# Patient Record
Sex: Female | Born: 1935 | Race: White | Hispanic: No | Smoking: Never smoker
Health system: Southern US, Community
[De-identification: ages and names within clinical notes are randomized; demographics above are authoritative.]

## PROBLEM LIST (undated history)

## (undated) DIAGNOSIS — E039 Hypothyroidism, unspecified: Secondary | ICD-10-CM

## (undated) DIAGNOSIS — E119 Type 2 diabetes mellitus without complications: Secondary | ICD-10-CM

## (undated) DIAGNOSIS — H6692 Otitis media, unspecified, left ear: Secondary | ICD-10-CM

## (undated) DIAGNOSIS — I10 Essential (primary) hypertension: Secondary | ICD-10-CM

## (undated) DIAGNOSIS — Z8701 Personal history of pneumonia (recurrent): Secondary | ICD-10-CM

## (undated) DIAGNOSIS — D071 Carcinoma in situ of vulva: Secondary | ICD-10-CM

## (undated) HISTORY — PX: CATARACT EXTRACTION W/ INTRAOCULAR LENS IMPLANT: SHX1309

---
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
## 1970-11-13 HISTORY — PX: ABDOMINAL HYSTERECTOMY: SHX81

## 1998-11-13 HISTORY — PX: SHOULDER ARTHROSCOPY: SHX128

## 2000-04-06 ENCOUNTER — Other Ambulatory Visit: Admission: RE | Admit: 2000-04-06 | Discharge: 2000-04-06 | Payer: Self-pay | Admitting: Family Medicine

## 2004-02-25 ENCOUNTER — Other Ambulatory Visit: Admission: RE | Admit: 2004-02-25 | Discharge: 2004-02-25 | Payer: Self-pay | Admitting: Family Medicine

## 2004-03-01 ENCOUNTER — Encounter: Payer: Self-pay | Admitting: Family Medicine

## 2004-03-15 ENCOUNTER — Encounter: Admission: RE | Admit: 2004-03-15 | Discharge: 2004-06-13 | Payer: Self-pay | Admitting: Family Medicine

## 2005-03-02 ENCOUNTER — Ambulatory Visit: Payer: Self-pay | Admitting: Family Medicine

## 2005-03-14 ENCOUNTER — Ambulatory Visit: Payer: Self-pay | Admitting: Family Medicine

## 2005-04-14 ENCOUNTER — Ambulatory Visit: Payer: Self-pay | Admitting: Internal Medicine

## 2005-05-02 ENCOUNTER — Ambulatory Visit: Payer: Self-pay | Admitting: Internal Medicine

## 2005-10-23 ENCOUNTER — Ambulatory Visit: Payer: Self-pay | Admitting: Family Medicine

## 2006-01-04 ENCOUNTER — Ambulatory Visit: Payer: Self-pay | Admitting: Family Medicine

## 2006-07-11 ENCOUNTER — Ambulatory Visit: Payer: Self-pay | Admitting: Family Medicine

## 2006-09-26 ENCOUNTER — Ambulatory Visit: Payer: Self-pay | Admitting: Family Medicine

## 2006-11-21 ENCOUNTER — Encounter: Admission: RE | Admit: 2006-11-21 | Discharge: 2006-11-21 | Payer: Self-pay | Admitting: Family Medicine

## 2006-11-21 ENCOUNTER — Ambulatory Visit: Payer: Self-pay | Admitting: Family Medicine

## 2007-03-05 ENCOUNTER — Ambulatory Visit: Payer: Self-pay | Admitting: Family Medicine

## 2007-04-04 ENCOUNTER — Ambulatory Visit: Payer: Self-pay | Admitting: Family Medicine

## 2007-04-04 ENCOUNTER — Encounter: Payer: Self-pay | Admitting: Family Medicine

## 2007-10-01 ENCOUNTER — Ambulatory Visit: Payer: Self-pay | Admitting: Family Medicine

## 2007-10-29 ENCOUNTER — Encounter: Payer: Self-pay | Admitting: Family Medicine

## 2007-10-29 ENCOUNTER — Telehealth: Payer: Self-pay | Admitting: Family Medicine

## 2008-01-22 ENCOUNTER — Ambulatory Visit: Payer: Self-pay | Admitting: Family Medicine

## 2008-01-22 DIAGNOSIS — E039 Hypothyroidism, unspecified: Secondary | ICD-10-CM | POA: Insufficient documentation

## 2008-01-22 DIAGNOSIS — E669 Obesity, unspecified: Secondary | ICD-10-CM | POA: Insufficient documentation

## 2008-01-28 ENCOUNTER — Telehealth: Payer: Self-pay | Admitting: Family Medicine

## 2008-07-15 ENCOUNTER — Encounter: Payer: Self-pay | Admitting: Family Medicine

## 2008-09-02 ENCOUNTER — Ambulatory Visit: Payer: Self-pay | Admitting: Family Medicine

## 2008-10-06 ENCOUNTER — Ambulatory Visit: Payer: Self-pay | Admitting: Family Medicine

## 2008-10-06 DIAGNOSIS — M722 Plantar fascial fibromatosis: Secondary | ICD-10-CM

## 2008-10-06 DIAGNOSIS — E1165 Type 2 diabetes mellitus with hyperglycemia: Secondary | ICD-10-CM

## 2008-10-12 LAB — CONVERTED CEMR LAB: Hgb A1c MFr Bld: 7.3 % — ABNORMAL HIGH (ref 4.6–6.0)

## 2009-02-09 ENCOUNTER — Telehealth: Payer: Self-pay | Admitting: Family Medicine

## 2009-03-23 ENCOUNTER — Encounter: Payer: Self-pay | Admitting: Family Medicine

## 2009-03-23 ENCOUNTER — Ambulatory Visit: Payer: Self-pay | Admitting: Family Medicine

## 2009-03-23 ENCOUNTER — Encounter (INDEPENDENT_AMBULATORY_CARE_PROVIDER_SITE_OTHER): Payer: Self-pay | Admitting: *Deleted

## 2009-03-23 ENCOUNTER — Other Ambulatory Visit: Admission: RE | Admit: 2009-03-23 | Discharge: 2009-03-23 | Payer: Self-pay | Admitting: Family Medicine

## 2009-03-23 DIAGNOSIS — E785 Hyperlipidemia, unspecified: Secondary | ICD-10-CM | POA: Insufficient documentation

## 2009-03-24 LAB — CONVERTED CEMR LAB
ALT: 17 units/L (ref 0–35)
AST: 23 units/L (ref 0–37)
Albumin: 3.8 g/dL (ref 3.5–5.2)
Basophils Absolute: 0.1 10*3/uL (ref 0.0–0.1)
Bilirubin, Direct: 0.1 mg/dL (ref 0.0–0.3)
CO2: 30 meq/L (ref 19–32)
GFR calc non Af Amer: 57.79 mL/min (ref >60)
Glucose, Bld: 68 mg/dL — ABNORMAL LOW (ref 70–99)
HCT: 36.5 % (ref 36.0–46.0)
Hemoglobin: 12.5 g/dL (ref 12.0–15.0)
LDL Cholesterol: 61 mg/dL (ref 0–99)
Lymphocytes Relative: 33.6 % (ref 12.0–46.0)
Lymphs Abs: 2.2 10*3/uL (ref 0.7–4.0)
MCHC: 34.2 g/dL (ref 30.0–36.0)
Monocytes Absolute: 0.2 10*3/uL (ref 0.1–1.0)
Monocytes Relative: 2.6 % — ABNORMAL LOW (ref 3.0–12.0)
Neutrophils Relative %: 60.4 % (ref 43.0–77.0)
RBC: 4.48 M/uL (ref 3.87–5.11)
RDW: 15 % — ABNORMAL HIGH (ref 11.5–14.6)
Sodium: 148 meq/L — ABNORMAL HIGH (ref 135–145)
Total CHOL/HDL Ratio: 2
Triglycerides: 77 mg/dL (ref 0.0–149.0)
VLDL: 15.4 mg/dL (ref 0.0–40.0)
WBC: 6.4 10*3/uL (ref 4.5–10.5)

## 2009-04-01 ENCOUNTER — Encounter: Payer: Self-pay | Admitting: Family Medicine

## 2009-06-17 ENCOUNTER — Ambulatory Visit: Payer: Self-pay | Admitting: Family Medicine

## 2009-06-18 ENCOUNTER — Telehealth (INDEPENDENT_AMBULATORY_CARE_PROVIDER_SITE_OTHER): Payer: Self-pay | Admitting: *Deleted

## 2009-08-18 ENCOUNTER — Ambulatory Visit: Payer: Self-pay | Admitting: Family Medicine

## 2010-04-12 ENCOUNTER — Ambulatory Visit: Payer: Self-pay | Admitting: Family Medicine

## 2010-04-13 LAB — CONVERTED CEMR LAB
BUN: 19 mg/dL (ref 6–23)
Calcium: 9.6 mg/dL (ref 8.4–10.5)
Chloride: 102 meq/L (ref 96–112)
Creatinine, Ser: 1 mg/dL (ref 0.4–1.2)
Glucose, Bld: 80 mg/dL (ref 70–99)
Potassium: 5 meq/L (ref 3.5–5.1)
Sodium: 140 meq/L (ref 135–145)

## 2010-04-14 ENCOUNTER — Telehealth: Payer: Self-pay | Admitting: Family Medicine

## 2010-05-11 ENCOUNTER — Telehealth: Payer: Self-pay | Admitting: Family Medicine

## 2010-05-12 ENCOUNTER — Encounter: Payer: Self-pay | Admitting: Family Medicine

## 2010-12-13 NOTE — Letter (Signed)
Summary: Generic Letter  [place] [facility]  [address] [place], [place] [address]   Phone: [phone]  Fax: [phone]    05/12/2010  To Whom It May Concern:  Mrs. Isobel Eisenhuth is physically fit to participate in swimming activities. If you have any concerns or questions please feel free to contact me.            Sincerely,      Dr. Hosie Spangle.[address]

## 2010-12-13 NOTE — Progress Notes (Signed)
Summary: note to swim  Phone Note Call from Patient   Caller: Patient Call For: Judithann Sheen MD Summary of Call: Needs note written on prescription pad stating s he is in good health to swim. [phone] Initial call taken by: Lynann Beaver CMA,  May 11, 2010 8:19 AM  Follow-up for Phone Call        ok to pick up today  Follow-up by: Pura Spice, RN,  May 12, 2010 8:10 AM  Additional Follow-up for Phone Call Additional follow up Details #1::        Notified pt. Additional Follow-up by: Lynann Beaver CMA,  May 12, 2010 8:19 AM

## 2010-12-13 NOTE — Progress Notes (Signed)
Summary: Marissa Ballard pt on brand name   Phone Note Call from Patient Call back at Home Phone [phone]   Caller: Patient Call For: Judithann Sheen MD Summary of Call: PT NEEDS GENERIC SYNTHOID 150 MCG CALL INTO [facility] [phone] Initial call taken by: Heron Sabins,  April 14, 2010 3:37 PM  Follow-up for Phone Call        pt on brand name and pt aware rx called in  Follow-up by: Pura Spice, RN,  April 14, 2010 5:03 PM     Appended Document: Marissa Ballard pt on brand name  spoke with pt and she thought she was on brand synthroid but  she has been on generic synthroid and called refills generic  synthroid called to [facility]  [address]

## 2010-12-13 NOTE — Assessment & Plan Note (Signed)
Summary: med check/refill/pt req to get thyroid rechecked/cjr   Vital Signs:  Patient profile:   77 year old female Weight:      208 pounds BMI:     35.83 O2 Sat:      95 % Temp:     98.5 degrees F Pulse rate:   56 / minute Pulse rhythm:   regular BP sitting:   124 / 82  (left arm)  Vitals Entered By: Pura Spice, RN (Apr 12, 2010 1:42 PM) CC: med ck and TSH to be ch'd    History of Present Illness: This 77 year old white married female with diabetes type 2 as well as hypothyroidism is in to have her medications refilled but does not desire a full physical examination at this time She is uncertain as to her blood glucose, relates she is now retired and does not feel as tired arthritis is controlled somewhat with diclofenac States will return for physical examination and later date  Allergies (verified): No Known Drug Allergies  Past History:  Past Medical History: Last updated: 04/04/2007 Diabetes mellitus, type I Hyperthyroidism  Past Surgical History: Last updated: 04/04/2007 Denies surgical history  Risk Factors: Smoking Status: never (04/04/2007)  Review of Systems      See HPI  The patient denies anorexia, fever, weight loss, weight gain, vision loss, decreased hearing, hoarseness, chest pain, syncope, dyspnea on exertion, peripheral edema, prolonged cough, headaches, hemoptysis, abdominal pain, melena, hematochezia, severe indigestion/heartburn, hematuria, incontinence, genital sores, muscle weakness, suspicious skin lesions, transient blindness, difficulty walking, depression, unusual weight change, abnormal bleeding, enlarged lymph nodes, angioedema, breast masses, and testicular masses.    Physical Exam  General:  Well-developed,well-nourished,in no acute distress; alert,appropriate and cooperative throughout examinationoverweight-appearing.   Lungs:  Normal respiratory effort, chest expands symmetrically. Lungs are clear to auscultation, no crackles or  wheezes. Heart:  Normal rate and regular rhythm. S1 and S2 normal without gallop, murmur, click, rub or other extra sounds. Extremities:  No clubbing, cyanosis, edema, or deformity noted with normal full range of motion of all joints.     Impression & Recommendations:  Problem # 1:  DIABETES MELLITUS, TYPE II, UNCONTROLLED (ICD-250.02) Assessment Improved  Her updated medication list for this problem includes:    Glipizide 10 Mg Tb24 (Glipizide) .Marland Kitchen... 1 am and 1 pm    Metformin Hcl 500 Mg Tabs (Metformin hcl) .Marland Kitchen... 1 am and p.m. for diabetes  Problem # 2:  OBESITY (ICD-278.00) Assessment: Unchanged  Problem # 3:  HYPOTHYROIDISM (ICD-244.9) Assessment: Improved  Her updated medication list for this problem includes:    Synthroid 150 Mcg Tabs (Levothyroxine sodium) .Marland Kitchen... 1 qd  Orders: Prescription Created Electronically [phone])  Problem # 4:  ARTHRITIS (ICD-716.90) Assessment: Unchanged diclofenac 75 mg b.i.d. p.c.  Complete Medication List: 1)  Glipizide 10 Mg Tb24 (Glipizide) .Marland Kitchen.. 1 am and 1 pm 2)  Synthroid 150 Mcg Tabs (Levothyroxine sodium) .Marland Kitchen.. 1 qd 3)  Vitamin D 56433 Unit Caps (Ergocalciferol) .Marland KitchenMarland KitchenMarland Kitchen 1 weekly for 12 weeks 4)  Diclofenac Sodium 75 Mg Tbec (Diclofenac sodium) .Marland Kitchen.. 1 two times a day  for inflammation 5)  Phenteramine 37.5  .Marland Kitchen.. 1 each am to decrease appetite 6)  Metformin Hcl 500 Mg Tabs (Metformin hcl) .Marland Kitchen.. 1 am and p.m. for diabetes  Other Orders: Venipuncture ([address]) TLB-A1C / Hgb A1C (Glycohemoglobin) (83036-A1C) TLB-TSH (Thyroid Stimulating Hormone) (84443-TSH) TLB-BMP (Basic Metabolic Panel-BMET) (80048-METABOL)  Patient Instructions: 1)  have ordered lab studies and will call results when they return. Have refilled her  medications as you requested 2)  Would like for you to return for full physical evaluation. 3)  His diabetes is not controlled we'll add metformin 500 mg b.i.d. Prescriptions: METFORMIN HCL 500 MG TABS (METFORMIN HCL) 1 AM and  p.m. for diabetes  #60 x 11   Entered and Authorized by:   Judithann Sheen MD   Signed by:   Judithann Sheen MD on 04/18/2010   Method used:   Electronically to        [facility] (retail)       [address]       [place], [place]  [address]       Ph: [phone]       Fax: [phone]   RxID:   [id number] SYNTHROID 150 MCG TABS (LEVOTHYROXINE SODIUM) 1 qd Brand medically necessary #30 x 6   Entered by:   Pura Spice, RN   Authorized by:   Judithann Sheen MD   Signed by:   Pura Spice, RN on 04/14/2010   Method used:   Electronically to        [facility] (retail)       [address]       [place], [place]  [address]       Ph: [phone]       Fax: [phone]   RxID:   [id number] GLIPIZIDE 10 MG TB24 (GLIPIZIDE) 1 AM and 1 PM  #60 x 11   Entered and Authorized by:   Judithann Sheen MD   Signed by:   Judithann Sheen MD on 04/12/2010   Method used:   Electronically to        [facility] (retail)       [address]       [place], [place]  [address]       Ph: [phone]       Fax: [phone]   RxID:   [id number] SYNTHROID 150 MCG TABS (LEVOTHYROXINE SODIUM) 1 qd Brand medically necessary #30 x 11   Entered and Authorized by:   Judithann Sheen MD   Signed by:   Judithann Sheen MD on 04/12/2010   Method used:   Electronically to        [facility] (retail)       [address]       [place], [place]  [address]       Ph: [phone]       Fax: [phone]   RxID:   [id number]

## 2013-03-20 ENCOUNTER — Ambulatory Visit: Payer: Self-pay | Admitting: Family Medicine

## 2013-03-21 ENCOUNTER — Ambulatory Visit (INDEPENDENT_AMBULATORY_CARE_PROVIDER_SITE_OTHER): Payer: Self-pay | Admitting: Family Medicine

## 2013-03-21 ENCOUNTER — Encounter: Payer: Self-pay | Admitting: Family Medicine

## 2013-03-21 ENCOUNTER — Telehealth: Payer: Self-pay | Admitting: Family Medicine

## 2013-03-21 VITALS — BP 104/70 | HR 62 | Temp 98.1°F | Ht 61.5 in | Wt 202.0 lb

## 2013-03-21 DIAGNOSIS — Z7689 Persons encountering health services in other specified circumstances: Secondary | ICD-10-CM

## 2013-03-21 DIAGNOSIS — E1149 Type 2 diabetes mellitus with other diabetic neurological complication: Secondary | ICD-10-CM

## 2013-03-21 DIAGNOSIS — E1165 Type 2 diabetes mellitus with hyperglycemia: Secondary | ICD-10-CM

## 2013-03-21 DIAGNOSIS — E1142 Type 2 diabetes mellitus with diabetic polyneuropathy: Secondary | ICD-10-CM

## 2013-03-21 DIAGNOSIS — Z7189 Other specified counseling: Secondary | ICD-10-CM

## 2013-03-21 DIAGNOSIS — E039 Hypothyroidism, unspecified: Secondary | ICD-10-CM

## 2013-03-21 DIAGNOSIS — E785 Hyperlipidemia, unspecified: Secondary | ICD-10-CM

## 2013-03-21 LAB — LIPID PANEL
Cholesterol: 106 mg/dL (ref 0–200)
HDL: 50.5 mg/dL (ref >39.00)
LDL Cholesterol: 39 mg/dL (ref 0–99)
Total CHOL/HDL Ratio: 2

## 2013-03-21 LAB — BASIC METABOLIC PANEL
GFR: 51.76 mL/min — ABNORMAL LOW (ref >60.00)
Sodium: 140 mEq/L (ref 135–145)

## 2013-03-21 LAB — MICROALBUMIN / CREATININE URINE RATIO
Creatinine,U: 150.9 mg/dL
Microalb, Ur: 2 mg/dL — ABNORMAL HIGH (ref 0.0–1.9)

## 2013-03-21 MED ORDER — LEVOTHYROXINE SODIUM 125 MCG PO TABS: 125.0000 ug | ORAL_TABLET | Freq: Every day | ORAL | Status: DC

## 2013-03-21 MED ORDER — GLIPIZIDE 10 MG PO TABS: 10.0000 mg | ORAL_TABLET | Freq: Two times a day (BID) | ORAL | Status: DC

## 2013-03-21 MED ORDER — METFORMIN HCL 500 MG PO TABS: 500.0000 mg | ORAL_TABLET | Freq: Two times a day (BID) | ORAL | Status: DC

## 2013-03-21 NOTE — Telephone Encounter (Signed)
Please let her know:  -labs show diabetes is not doing well.  -it will be very important that she work on eating a healthy diet low or no carbs and get daily cardiovascular exercise. -also, I would advise starting metformin in addition to the glipizide. We discussed this at her appointment.  Needs to follow up in 3 months and will recheck diabetes then. Schedule appt for her Elease Hashimoto. Thanks.  She needs to check BS at home fasting a few times per week and if she ever feels bad.  If any blood sugar higher the 200 or lower then 70 she should call us immediatetly. If lower then 70 should eat a small snack or drink 4 oz of juice and recheck BS.

## 2013-03-21 NOTE — Patient Instructions (Addendum)
-  We have ordered labs or studies at this visit. It can take up to 1-2 weeks for results and processing. We will contact you with instructions IF your results are abnormal. Normal results will be released to your [facility]. If you have not heard from [place] or can not find your results in [facility] in 2 weeks please contact our office.  -PLEASE SIGN UP FOR MYCHART TODAY   We recommend the following healthy lifestyle measures: - eat a healthy diet consisting of lots of vegetables, fruits, beans, nuts, seeds, healthy meats such as white chicken and fish and whole grains.  - avoid fried foods, fast food, processed foods, sodas, red meet and other fattening foods.  - get a least 150 minutes of aerobic exercise per week.   -check on cost of pneumonia vaccine and you should get this  -check on cost of shingles vaccine  -schedule mammogram  -schedule eye exam  -check feet daily  -check fasting blood sugar a few times per week and keep a written log to bring to appointments  Follow up in: as instructed after labs

## 2013-03-21 NOTE — Progress Notes (Signed)
Chief Complaint  Patient presents with  . Establish Care    HPI:  Marissa Ballard is here to establish care. Used to see Dr. Scotty Court - she saw Dr. Leticia Clas, but she did not like the office staff there.   Last PCP and physical:   Has the following chronic problems and concerns today:  Diabetes: -on glipizide only 10mg  bid -does not check home blood sugars -active, diet ok -last eye exam: does not get eye exam  -las labs about 13 months ago -denies: polyuria, polydipsia, foot lesions, vision changes  -does not take asa daily - used to take daily  Hypothyroidism: -reports on synthroid for a long time -feels good -denies: heat/cold intolerance, fatigue, palpitations, skin changes  Pt denies ever having osteoporosis or hyperlipidemia. She report anemia with child birth but denies any since.  Patient Active Problem List   Diagnosis Date Noted  . HYPERLIPIDEMIA 03/23/2009  . DIABETES MELLITUS, TYPE II, UNCONTROLLED 10/06/2008  . PLANTAR FASCIITIS 10/06/2008  . HYPOTHYROIDISM 01/22/2008  . OBESITY 01/22/2008    Health Maintenance: -she does not get mammograms -she did get a colonoscopy less then 10 years ago and was noram -she reports she had pneumovax   ROS: See pertinent positives and negatives per HPI.  Past Medical History  Diagnosis Date  . Diabetes   . Thyroid disease     Family History  Problem Relation Age of Onset  . Breast cancer Mother   . Heart disease Mother   . Heart attack Father 55    History   Social History  . Marital Status: Married    Spouse Name: N/A    Number of Children: N/A  . Years of Education: N/A   Social History Main Topics  . Smoking status: Never Smoker   . Smokeless tobacco: None  . Alcohol Use: No  . Drug Use: None  . Sexually Active: None   Other Topics Concern  . 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      Home Situation: lives with her husband and  he requires a lot care as he has dementia and CHF      Spiritual Beliefs:       Lifestyle: active at work, no regular cardiovascular activity, diet - avoid beef and pork, weakness is sweets             Current outpatient prescriptions:glipiZIDE (GLUCOTROL) 10 MG tablet, Take 10 mg by mouth 2 (two) times daily before a meal., Disp: , Rfl: ;  levothyroxine (SYNTHROID, LEVOTHROID) 125 MCG tablet, Take 125 mcg by mouth daily before breakfast., Disp: , Rfl:   EXAM:  Filed Vitals:   03/21/13 0807  BP: 104/70  Pulse: 62  Temp: 98.1 F (36.7 C)    Body mass index is 37.55 kg/(m^2).  GENERAL: vitals reviewed and listed above, alert, oriented, appears well hydrated and in no acute distress  HEENT: atraumatic, conjunttiva clear, no obvious abnormalities on inspection of external nose and ears  NECK: no obvious masses on inspection  LUNGS: clear to auscultation bilaterally, no wheezes, rales or rhonchi, good air movement  CV: HRRR, no peripheral edema  MS: moves all extremities without noticeable abnormality  PSYCH: pleasant and cooperative, no obvious depression or anxiety  Diabetic foot exam: see in chart  ASSESSMENT AND PLAN:  Discussed the following assessment and plan:  Encounter to establish care - Plan: Basic metabolic panel  Hyperlipemia -  Plan: Lipid Panel  Type 2 diabetes, uncontrolled, with neuropathy - Plan: Hemoglobin A1c, Microalbumin/Creatinine Ratio, Urine  HYPOTHYROIDISM - Plan: TSH  -We reviewed the PMH, PSH, FH, SH, Meds and Allergies. -We provided refills for any medications we will prescribe as needed. -We addressed current concerns per orders and patient instructions. -We have advised patient to follow up per instructions below. -FASTING LABS today - will refill meds after labs result -discussed importance of lifestyle -she is resistant health maintenance measures  -Patient advised to return or notify a doctor immediately if symptoms worsen or  persist or new concerns arise.  There are no Patient Instructions on file for this visit.   [name] R.

## 2013-03-24 ENCOUNTER — Telehealth: Payer: Self-pay

## 2013-03-24 NOTE — Telephone Encounter (Signed)
Find out what [facility] pharmacy is requesting and what is needed. Thanks.

## 2013-03-24 NOTE — Telephone Encounter (Signed)
Left a message for pt to return call 

## 2013-03-24 NOTE — Telephone Encounter (Signed)
For Leveothyroxine 

## 2013-03-24 NOTE — Telephone Encounter (Signed)
Received a fax from [facility] wanting to change from a mylan product to a [employer] product. NTI drug. Pls advise.

## 2013-03-26 NOTE — Telephone Encounter (Signed)
Called and spoke with pt and pt is aware.  

## 2013-03-26 NOTE — Telephone Encounter (Signed)
Called and spoke with Okey Regal and medication changed to [employer] product (sent to Dr. Selena Batten due to sometimes switching causes a change in levels).  Verbal given.  Ok per Dr. Selena Batten.

## 2013-07-08 ENCOUNTER — Other Ambulatory Visit: Payer: Self-pay

## 2013-07-08 MED ORDER — METFORMIN HCL 500 MG PO TABS: 500.0000 mg | ORAL_TABLET | Freq: Two times a day (BID) | ORAL | Status: DC

## 2013-11-28 ENCOUNTER — Ambulatory Visit (INDEPENDENT_AMBULATORY_CARE_PROVIDER_SITE_OTHER): Payer: Self-pay | Admitting: Family Medicine

## 2013-11-28 ENCOUNTER — Encounter: Payer: Self-pay | Admitting: Family Medicine

## 2013-11-28 VITALS — BP 120/70 | Temp 97.6°F | Wt 205.0 lb

## 2013-11-28 DIAGNOSIS — E039 Hypothyroidism, unspecified: Secondary | ICD-10-CM

## 2013-11-28 DIAGNOSIS — E1165 Type 2 diabetes mellitus with hyperglycemia: Principal | ICD-10-CM

## 2013-11-28 DIAGNOSIS — M549 Dorsalgia, unspecified: Secondary | ICD-10-CM

## 2013-11-28 DIAGNOSIS — E785 Hyperlipidemia, unspecified: Secondary | ICD-10-CM

## 2013-11-28 DIAGNOSIS — M542 Cervicalgia: Secondary | ICD-10-CM

## 2013-11-28 DIAGNOSIS — B379 Candidiasis, unspecified: Secondary | ICD-10-CM

## 2013-11-28 DIAGNOSIS — IMO0001 Reserved for inherently not codable concepts without codable children: Secondary | ICD-10-CM

## 2013-11-28 LAB — LIPID PANEL
CHOLESTEROL: 128 mg/dL (ref 0–200)
HDL: 50 mg/dL (ref >39.00)
TRIGLYCERIDES: 252 mg/dL — AB (ref 0.0–149.0)
Total CHOL/HDL Ratio: 3
VLDL: 50.4 mg/dL — ABNORMAL HIGH (ref 0.0–40.0)

## 2013-11-28 LAB — HEMOGLOBIN A1C: HEMOGLOBIN A1C: 9.4 % — AB (ref 4.6–6.5)

## 2013-11-28 LAB — BASIC METABOLIC PANEL
BUN: 22 mg/dL (ref 6–23)
CALCIUM: 9 mg/dL (ref 8.4–10.5)
CHLORIDE: 105 meq/L (ref 96–112)
CO2: 27 mEq/L (ref 19–32)
CREATININE: 1.3 mg/dL — AB (ref 0.4–1.2)
GFR: 42.16 mL/min — AB (ref >60.00)
Glucose, Bld: 216 mg/dL — ABNORMAL HIGH (ref 70–99)
Potassium: 4.7 mEq/L (ref 3.5–5.1)
Sodium: 140 mEq/L (ref 135–145)

## 2013-11-28 LAB — MICROALBUMIN / CREATININE URINE RATIO
CREATININE, U: 131 mg/dL
MICROALB UR: 8.8 mg/dL — AB (ref 0.0–1.9)
Microalb Creat Ratio: 6.7 mg/g (ref 0.0–30.0)

## 2013-11-28 LAB — T4, FREE: Free T4: 1.23 ng/dL (ref 0.60–1.60)

## 2013-11-28 LAB — TSH: TSH: 3.77 u[IU]/mL (ref 0.35–5.50)

## 2013-11-28 LAB — LDL CHOLESTEROL, DIRECT: LDL DIRECT: 48.8 mg/dL

## 2013-11-28 MED ORDER — FLUCONAZOLE 150 MG PO TABS: 150.0000 mg | ORAL_TABLET | Freq: Once | ORAL | Status: DC

## 2013-11-28 NOTE — Progress Notes (Signed)
Pre visit review using our clinic review tool, if applicable. No additional management support is needed unless otherwise documented below in the visit note. 
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
## 2013-11-28 NOTE — Patient Instructions (Signed)
-  We have ordered labs or studies at this visit. It can take up to 1-2 weeks for results and processing. We will contact you with instructions IF your results are abnormal. Normal results will be released to your [facility]. If you have not heard from [place] or can not find your results in [facility] in 2 weeks please contact our office.  Heat and tylenol 500-1000mg  up to 3 times daily for back and neck pain, walking and gentle activity is good  Follow up in one month for medicare wellness exam

## 2013-12-01 ENCOUNTER — Telehealth: Payer: Self-pay

## 2013-12-01 NOTE — Telephone Encounter (Signed)
Relevant patient education mailed to patient.  

## 2013-12-03 ENCOUNTER — Telehealth: Payer: Self-pay | Admitting: Family Medicine

## 2013-12-03 NOTE — Telephone Encounter (Addendum)
Pt is waiting on refills for levothyroxine (SYNTHROID, LEVOTHROID) 125 MCG tablet and metformin, requesting call back by 9:00 am 1/22. Pt phar [facility] on [address]

## 2013-12-04 MED ORDER — METFORMIN HCL 1000 MG PO TABS: 1000.0000 mg | ORAL_TABLET | Freq: Two times a day (BID) | ORAL | Status: DC

## 2013-12-04 NOTE — Telephone Encounter (Signed)
Per Dr. Maudie Mercury pt should do 1000 mg in the morning and 500 at night x 1 week then go to 1000 bid.  Will call pt in the am before work.

## 2013-12-04 NOTE — Telephone Encounter (Signed)
Ok to refill synthroid. Would advised increasing metformin by 500mg  per week to 100mg  bid. Also needs to work very hard on diet and exercise and if she likes we can have her see the diabetes educator for help with this. If not going to see endocrinologist would advise she follow up in 2 months here and bring blood sugar log and will discuss other options for treatment.
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
## 2013-12-05 MED ORDER — LEVOTHYROXINE SODIUM 125 MCG PO TABS
125.0000 ug | ORAL_TABLET | Freq: Every day | ORAL | Status: DC
Start: 2013-12-05 — End: 2014-03-03

## 2013-12-05 NOTE — Telephone Encounter (Signed)
Rx for synthroid resent to pharmacy.

## 2013-12-05 NOTE — Telephone Encounter (Signed)
Called and spoke with pt and pt is aware.  

## 2013-12-05 NOTE — Addendum Note (Signed)
Addended by: Colleen Can on: 12/05/2013 01:23 PM   Modules accepted: Orders

## 2014-01-14 ENCOUNTER — Telehealth: Payer: Self-pay | Admitting: Family Medicine

## 2014-01-14 NOTE — Telephone Encounter (Signed)
Pt would like to switch pcp to dr fry. Pt is no happy with dr Maudie Mercury. Pt has medicare

## 2014-01-14 NOTE — Telephone Encounter (Signed)
Sorry but I am NOT taking new patients

## 2014-01-19 NOTE — Telephone Encounter (Signed)
Pt is aware.  

## 2014-03-03 ENCOUNTER — Ambulatory Visit: Payer: Self-pay | Admitting: Family Medicine

## 2014-03-03 VITALS — BP 140/70 | HR 52 | Temp 98.3°F | Resp 16 | Ht 60.5 in | Wt 192.0 lb

## 2014-03-03 DIAGNOSIS — E039 Hypothyroidism, unspecified: Secondary | ICD-10-CM

## 2014-03-03 DIAGNOSIS — E119 Type 2 diabetes mellitus without complications: Secondary | ICD-10-CM

## 2014-03-03 DIAGNOSIS — N952 Postmenopausal atrophic vaginitis: Secondary | ICD-10-CM | POA: Insufficient documentation

## 2014-03-03 LAB — COMPREHENSIVE METABOLIC PANEL
ALT: 15 U/L (ref 0–35)
AST: 16 U/L (ref 0–37)
Albumin: 4.1 g/dL (ref 3.5–5.2)
Alkaline Phosphatase: 54 U/L (ref 39–117)
BUN: 18 mg/dL (ref 6–23)
CO2: 28 mEq/L (ref 19–32)
Calcium: 9.3 mg/dL (ref 8.4–10.5)
Chloride: 104 mEq/L (ref 96–112)
Creat: 1.16 mg/dL — ABNORMAL HIGH (ref 0.50–1.10)
Glucose, Bld: 176 mg/dL — ABNORMAL HIGH (ref 70–99)
Potassium: 5 mEq/L (ref 3.5–5.3)
Sodium: 140 mEq/L (ref 135–145)
Total Bilirubin: 0.5 mg/dL (ref 0.2–1.2)
Total Protein: 7.2 g/dL (ref 6.0–8.3)

## 2014-03-03 LAB — POCT GLYCOSYLATED HEMOGLOBIN (HGB A1C): Hemoglobin A1C: 7.7

## 2014-03-03 MED ORDER — LEVOTHYROXINE SODIUM 125 MCG PO TABS: 125.0000 ug | ORAL_TABLET | Freq: Every day | ORAL | Status: DC

## 2014-03-03 MED ORDER — METFORMIN HCL 1000 MG PO TABS
1000.0000 mg | ORAL_TABLET | Freq: Two times a day (BID) | ORAL | Status: DC
Start: 2014-03-03 — End: 2015-02-08

## 2014-03-03 MED ORDER — ESTRADIOL 0.1 MG/GM VA CREA: 1.0000 | TOPICAL_CREAM | Freq: Every day | VAGINAL | Status: DC

## 2014-03-03 NOTE — Progress Notes (Signed)
Subjective:  This chart was scribed for Robyn Haber, MD by Stacy Gardner, [facility] Scribe. The patient was seen in room and the patient's care was started at 8:05 AM.   Patient ID: Marissa Ballard, female    DOB: 1934/08/01, 80 y.o.   MRN: [id number] Chief Complaint  Patient presents with   diabetes follow-up, medication refill    HPI HPI Comments: Marissa Ballard is a 80 y.o. female who arrives to the [facility] for a medication refill and a diabetes type II follow-up. Pt mentions that she is doing better. Pt is regularly taking Metformin 1000 mg twice a day and Synthroid 0.125 mg  s instructed.  Pt had recent weight loss, mild blurred vision and frequency. Denies difficulty urinating. Pt reports having moderate vaginal dryness. She was dx DM over ten years ago. Pt takes thyroid medication She has never tried taking any HTN medication. Pt does not have insurance or Medicare.  She only goes to her PCP once a year. Pt does not like to take multiple medications because her husband has various complications with taking multiple medications.  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   Past Medical History  Diagnosis Date   Diabetes    Thyroid disease    Past Surgical History  Procedure Laterality Date   Appendectomy  1952   Abdominal hysterectomy  1972   Current outpatient prescriptions:levothyroxine (SYNTHROID, LEVOTHROID) 125 MCG tablet, Take 1 tablet (125 mcg total) by mouth daily before breakfast., Disp: 90 tablet, Rfl: 3;  metFORMIN (GLUCOPHAGE) 1000 MG tablet, Take 1 tablet (1,000 mg total) by mouth 2 (two) times daily with a meal., Disp: 180 tablet, Rfl: 0;  glipiZIDE (GLUCOTROL) 10 MG tablet, Take 1 tablet (10 mg total) by mouth 2 (two) times daily before a meal., Disp: 180 tablet, Rfl: 3 metFORMIN (GLUCOPHAGE) 500 MG tablet, Take 1 tablet (500 mg total) by mouth 2 (two) times daily with a meal., Disp: 60 tablet, Rfl: 0 No Known  Allergies   Review of Systems  Constitutional: Positive for unexpected weight change.  Eyes:       Mild blurred vision  Genitourinary: Positive for frequency.       Vaginal dryness   Neurological: Negative for numbness.       No tingling        Objective:   Physical Exam  Patient is alert, obese, and articulate. HEENT: Patient does have a small cataract in the right eye it is obviously had cataract surgery in the left. She has some whitening around the arterioles in both eyes. TMs are normal. Oropharynx shows no cavities or suspicious lesions Neck: Supple no adenopathy or bruits Chest: Clear heart: Regular without murmur or gallop Abdomen: Soft nontender Extremities: No edema  Results for orders placed in visit on 03/03/14  POCT GLYCOSYLATED HEMOGLOBIN (HGB A1C)      Result Value Ref Range   Hemoglobin A1C 7.7      Discussed course of care with pt and result of laboratory tests.  Pt understands and agrees.     Assessment & Ballard:    Patient is reluctant to take any statin drug despite my asking her about her current standard of care for diabetes. She's reluctant to take any other drug actually. She feels she is doing better with her diabetes control.  There is some improvement in the A1c and her last TSH was fine. I've reviewed with her the standards of care  for diabetes and have given her information  Diabetes - Ballard: Comprehensive metabolic panel, POCT glycosylated hemoglobin (Hb A1C), metFORMIN (GLUCOPHAGE) 1000 MG tablet  Hypothyroidism - Ballard: levothyroxine (SYNTHROID, LEVOTHROID) 125 MCG tablet  Signed, Robyn Haber, MD   \

## 2014-03-03 NOTE — Patient Instructions (Signed)

## 2015-02-04 ENCOUNTER — Encounter: Payer: Self-pay | Admitting: Family Medicine

## 2015-02-04 ENCOUNTER — Ambulatory Visit (INDEPENDENT_AMBULATORY_CARE_PROVIDER_SITE_OTHER): Payer: Self-pay | Admitting: Family Medicine

## 2015-02-04 VITALS — BP 182/75 | HR 85 | Temp 98.2°F | Resp 16 | Ht 61.0 in | Wt 176.4 lb

## 2015-02-04 DIAGNOSIS — E039 Hypothyroidism, unspecified: Secondary | ICD-10-CM

## 2015-02-04 DIAGNOSIS — R001 Bradycardia, unspecified: Secondary | ICD-10-CM

## 2015-02-04 DIAGNOSIS — I10 Essential (primary) hypertension: Secondary | ICD-10-CM

## 2015-02-04 DIAGNOSIS — E119 Type 2 diabetes mellitus without complications: Secondary | ICD-10-CM

## 2015-02-04 DIAGNOSIS — H6121 Impacted cerumen, right ear: Secondary | ICD-10-CM

## 2015-02-04 DIAGNOSIS — R0989 Other specified symptoms and signs involving the circulatory and respiratory systems: Secondary | ICD-10-CM

## 2015-02-04 LAB — COMPREHENSIVE METABOLIC PANEL
ALT: 12 U/L (ref 0–35)
AST: 15 U/L (ref 0–37)
Albumin: 4.1 g/dL (ref 3.5–5.2)
Alkaline Phosphatase: 53 U/L (ref 39–117)
BUN: 20 mg/dL (ref 6–23)
CO2: 24 mEq/L (ref 19–32)
Calcium: 9.3 mg/dL (ref 8.4–10.5)
Chloride: 105 mEq/L (ref 96–112)
Creat: 0.97 mg/dL (ref 0.50–1.10)
Glucose, Bld: 135 mg/dL — ABNORMAL HIGH (ref 70–99)
Potassium: 4.4 mEq/L (ref 3.5–5.3)
Sodium: 138 mEq/L (ref 135–145)
Total Bilirubin: 0.4 mg/dL (ref 0.2–1.2)
Total Protein: 6.7 g/dL (ref 6.0–8.3)

## 2015-02-04 LAB — POCT GLYCOSYLATED HEMOGLOBIN (HGB A1C): Hemoglobin A1C: 7

## 2015-02-04 MED ORDER — LEVOTHYROXINE SODIUM 125 MCG PO TABS: 125.0000 ug | ORAL_TABLET | Freq: Every day | ORAL | Status: AC

## 2015-02-04 MED ORDER — LISINOPRIL 20 MG PO TABS: 20.0000 mg | ORAL_TABLET | Freq: Every day | ORAL | Status: DC

## 2015-02-04 NOTE — Progress Notes (Addendum)
Subjective:  This chart was scribed for Robyn Haber, MD by Molli Posey, Medical scribe. This patient was seen in ROOM 26 and the patient's care was started 10:30 AM.   Patient ID: Marissa Ballard, female    DOB: 1933/12/19, 81 y.o.   MRN: [id number]   Chief Complaint  Patient presents with  . Medication Refill    metformin, synthroid   HPI HPI Comments: Marissa Ballard is a 81 y.o. female with a history of DM and hypothyroidism who presents to [facility] for a yearly checkup and for a medication refill for her metformin and synthroid. Pt states she has been worried about her blood sugar and says she thinks her metformin medication has been causing her to not have control of her bowels. She state that she has been cutting the pill in half and taking only half in the mornings. Pt states that she has been doing well recently and feels well at this time. She states that she is active and has improved her diet. She says that she thinks she has been having some trouble hearing recently and has been asking people to repeat themselves. Pt denies any SOB, foot numbness and palpitations.   Patient Active Problem List   Diagnosis Date Noted  . DIABETES MELLITUS, TYPE II, UNCONTROLLED 10/06/2008    Priority: High  . HYPOTHYROIDISM 01/22/2008    Priority: High  . OBESITY 01/22/2008    Priority: High  . Atrophic vaginitis 03/03/2014    Priority: Medium   Past Medical History  Diagnosis Date  . Diabetes   . Thyroid disease    No Known Allergies Current Outpatient Prescriptions on File Prior to Visit  Medication Sig Dispense Refill  . metFORMIN (GLUCOPHAGE) 1000 MG tablet Take 1 tablet (1,000 mg total) by mouth 2 (two) times daily with a meal. 180 tablet 3   No current facility-administered medications on file prior to visit.   Family History  Problem Relation Age of Onset  . Breast cancer Mother   . Heart disease Mother   . Heart attack Father 37   Past Surgical History  Procedure  Laterality Date  . Appendectomy  1952  . Abdominal hysterectomy  1972   History   Social History  . Marital Status: Married    Spouse Name: N/A  . Number of Children: N/A  . Years of Education: N/A   Occupational History  . Not on file.   Social History Main Topics  . Smoking status: Never Smoker   . Smokeless tobacco: Not on file  . Alcohol Use: No  . Drug Use: Not on file  . Sexual Activity: Not on file   Other Topics Concern  . 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      Home Situation: lives with her husband and he requires a lot care as he has dementia and CHF      Spiritual Beliefs:       Lifestyle: active at work, no regular cardiovascular activity, diet - avoid beef and pork, weakness is sweets            Review of Systems  Cardiovascular: Negative for palpitations.  Neurological: Negative for numbness.  All other systems reviewed and are negative.     Objective:   Physical Exam  Constitutional: She is oriented to person, place, and time. She appears well-developed and well-nourished.  HENT:  Head:  Normocephalic and atraumatic.  Eyes: Right eye exhibits no discharge. Left eye exhibits no discharge.  Neck: Neck supple. No tracheal deviation present.  Cardiovascular: Normal rate.  An irregular rhythm present.  Pulmonary/Chest: Effort normal. No respiratory distress.  Abdominal: She exhibits no distension.  Musculoskeletal:  Normal pedal pulses. No calluses or skin breaks in her feet.   Neurological: She is alert and oriented to person, place, and time.  Skin: Skin is warm and dry.  Psychiatric: She has a normal mood and affect. Her behavior is normal.  Nursing note and vitals reviewed. EKG:  Sinus arrhythmia Filed Vitals:   02/04/15 1017  BP: 182/75  Pulse: 85  Temp: 98.2 F (36.8 C)  TempSrc: Oral  Resp: 16  Height: 5\' 1"  (1.549 m)  Weight: 176 lb 6.4 oz (80.015 kg)    SpO2: 98%  I lavaged the right ear clear of cerumen with improvement in hearing Results for orders placed or performed in visit on 02/04/15  POCT glycosylated hemoglobin (Hb A1C)  Result Value Ref Range   Hemoglobin A1C 7.0        Assessment & Ballard:   This chart was scribed in my presence and reviewed by me personally.    ICD-9-CM ICD-10-CM   1. Hypothyroidism, unspecified hypothyroidism type 244.9 E03.9 levothyroxine (SYNTHROID, LEVOTHROID) 125 MCG tablet     TSH  2. Type 2 diabetes mellitus, controlled 250.00 E11.9 Comprehensive metabolic panel     POCT glycosylated hemoglobin (Hb A1C)  3. Pulse irregularity 785.9 R09.89 EKG 12-Lead     EKG 12-Lead  4. Cerumen impaction, right 380.4 H61.21   5. Essential hypertension 401.9 I10 lisinopril (PRINIVIL,ZESTRIL) 20 MG tablet  6. Sinus bradycardia 427.89 R00.1      Signed, Robyn Haber, MD

## 2015-02-05 LAB — TSH: TSH: 1.449 u[IU]/mL (ref 0.350–4.500)

## 2015-02-08 ENCOUNTER — Other Ambulatory Visit: Payer: Self-pay | Admitting: Family Medicine

## 2015-05-07 ENCOUNTER — Other Ambulatory Visit: Payer: Self-pay | Admitting: Family Medicine

## 2015-05-13 ENCOUNTER — Emergency Department (HOSPITAL_COMMUNITY): Payer: Self-pay

## 2015-05-13 ENCOUNTER — Encounter (HOSPITAL_COMMUNITY): Payer: Self-pay | Admitting: Vascular Surgery

## 2015-05-13 ENCOUNTER — Observation Stay (HOSPITAL_COMMUNITY)
Admission: EM | Admit: 2015-05-13 | Discharge: 2015-05-14 | Disposition: A | Discharge status: Home / Self Care | Payer: Medicare Other | Attending: Family Medicine | Admitting: Family Medicine

## 2015-05-13 DIAGNOSIS — E860 Dehydration: Secondary | ICD-10-CM | POA: Insufficient documentation

## 2015-05-13 DIAGNOSIS — E872 Acidosis, unspecified: Secondary | ICD-10-CM | POA: Diagnosis present

## 2015-05-13 DIAGNOSIS — R1031 Right lower quadrant pain: Secondary | ICD-10-CM | POA: Insufficient documentation

## 2015-05-13 DIAGNOSIS — R1030 Lower abdominal pain, unspecified: Secondary | ICD-10-CM

## 2015-05-13 DIAGNOSIS — E039 Hypothyroidism, unspecified: Secondary | ICD-10-CM | POA: Insufficient documentation

## 2015-05-13 DIAGNOSIS — R197 Diarrhea, unspecified: Secondary | ICD-10-CM | POA: Insufficient documentation

## 2015-05-13 DIAGNOSIS — R1032 Left lower quadrant pain: Principal | ICD-10-CM | POA: Insufficient documentation

## 2015-05-13 DIAGNOSIS — I10 Essential (primary) hypertension: Secondary | ICD-10-CM | POA: Insufficient documentation

## 2015-05-13 DIAGNOSIS — R300 Dysuria: Secondary | ICD-10-CM | POA: Insufficient documentation

## 2015-05-13 DIAGNOSIS — R61 Generalized hyperhidrosis: Secondary | ICD-10-CM | POA: Insufficient documentation

## 2015-05-13 DIAGNOSIS — R112 Nausea with vomiting, unspecified: Secondary | ICD-10-CM | POA: Diagnosis present

## 2015-05-13 DIAGNOSIS — E119 Type 2 diabetes mellitus without complications: Secondary | ICD-10-CM | POA: Insufficient documentation

## 2015-05-13 DIAGNOSIS — K921 Melena: Secondary | ICD-10-CM | POA: Insufficient documentation

## 2015-05-13 DIAGNOSIS — D72829 Elevated white blood cell count, unspecified: Secondary | ICD-10-CM | POA: Insufficient documentation

## 2015-05-13 DIAGNOSIS — N289 Disorder of kidney and ureter, unspecified: Secondary | ICD-10-CM

## 2015-05-13 DIAGNOSIS — Z90710 Acquired absence of both cervix and uterus: Secondary | ICD-10-CM | POA: Insufficient documentation

## 2015-05-13 DIAGNOSIS — N179 Acute kidney failure, unspecified: Secondary | ICD-10-CM | POA: Insufficient documentation

## 2015-05-13 DIAGNOSIS — Z79899 Other long term (current) drug therapy: Secondary | ICD-10-CM | POA: Insufficient documentation

## 2015-05-13 LAB — CBC WITH DIFFERENTIAL/PLATELET
BASOS PCT: 0 % (ref 0–1)
Basophils Absolute: 0 10*3/uL (ref 0.0–0.1)
EOS ABS: 0.1 10*3/uL (ref 0.0–0.7)
Eosinophils Relative: 1 % (ref 0–5)
HCT: 41.4 % (ref 36.0–46.0)
Hemoglobin: 13.4 g/dL (ref 12.0–15.0)
Lymphocytes Relative: 19 % (ref 12–46)
Lymphs Abs: 2.1 10*3/uL (ref 0.7–4.0)
MCH: 27 pg (ref 26.0–34.0)
MCHC: 32.4 g/dL (ref 30.0–36.0)
MCV: 83.5 fL (ref 78.0–100.0)
Monocytes Absolute: 0.4 10*3/uL (ref 0.1–1.0)
Monocytes Relative: 4 % (ref 3–12)
Neutro Abs: 8.5 10*3/uL — ABNORMAL HIGH (ref 1.7–7.7)
Neutrophils Relative %: 77 % (ref 43–77)
PLATELETS: 223 10*3/uL (ref 150–400)
RBC: 4.96 MIL/uL (ref 3.87–5.11)
RDW: 15.3 % (ref 11.5–15.5)
WBC: 11 10*3/uL — ABNORMAL HIGH (ref 4.0–10.5)

## 2015-05-13 LAB — URINE MICROSCOPIC-ADD ON

## 2015-05-13 LAB — URINALYSIS, ROUTINE W REFLEX MICROSCOPIC
Bilirubin Urine: NEGATIVE
Glucose, UA: NEGATIVE mg/dL
Ketones, ur: NEGATIVE mg/dL
Leukocytes, UA: NEGATIVE
Nitrite: NEGATIVE
Protein, ur: >300 mg/dL — AB
Specific Gravity, Urine: 1.013 (ref 1.005–1.030)
Urobilinogen, UA: 0.2 mg/dL (ref 0.0–1.0)
pH: 5.5 (ref 5.0–8.0)

## 2015-05-13 LAB — LACTIC ACID, PLASMA: Lactic Acid, Venous: 3.3 mmol/L (ref 0.5–2.0)

## 2015-05-13 LAB — COMPREHENSIVE METABOLIC PANEL
ALT: 22 U/L (ref 14–54)
AST: 36 U/L (ref 15–41)
Albumin: 4 g/dL (ref 3.5–5.0)
Alkaline Phosphatase: 60 U/L (ref 38–126)
Anion gap: 13 (ref 5–15)
BUN: 28 mg/dL — ABNORMAL HIGH (ref 6–20)
CO2: 19 mmol/L — ABNORMAL LOW (ref 22–32)
Calcium: 9.3 mg/dL (ref 8.9–10.3)
Chloride: 105 mmol/L (ref 101–111)
Creatinine, Ser: 1.59 mg/dL — ABNORMAL HIGH (ref 0.44–1.00)
GFR calc Af Amer: 35 mL/min — ABNORMAL LOW (ref >60)
GFR calc non Af Amer: 30 mL/min — ABNORMAL LOW (ref >60)
Glucose, Bld: 237 mg/dL — ABNORMAL HIGH (ref 65–99)
Potassium: 4.7 mmol/L (ref 3.5–5.1)
Sodium: 137 mmol/L (ref 135–145)
Total Bilirubin: 0.5 mg/dL (ref 0.3–1.2)
Total Protein: 7.4 g/dL (ref 6.5–8.1)

## 2015-05-13 LAB — I-STAT CG4 LACTIC ACID, ED: LACTIC ACID, VENOUS: 3.44 mmol/L — AB (ref 0.5–2.0)

## 2015-05-13 LAB — GLUCOSE, CAPILLARY: Glucose-Capillary: 206 mg/dL — ABNORMAL HIGH (ref 65–99)

## 2015-05-13 LAB — CLOSTRIDIUM DIFFICILE BY PCR: CDIFFPCR: NEGATIVE

## 2015-05-13 LAB — LIPASE, BLOOD: Lipase: 41 U/L (ref 22–51)

## 2015-05-13 LAB — I-STAT TROPONIN, ED: Troponin i, poc: 0.03 ng/mL (ref 0.00–0.08)

## 2015-05-13 LAB — POC OCCULT BLOOD, ED: Fecal Occult Bld: POSITIVE — AB

## 2015-05-13 MED ORDER — ONDANSETRON HCL 4 MG PO TABS: 4.0000 mg | ORAL_TABLET | Freq: Four times a day (QID) | ORAL | Status: DC | PRN

## 2015-05-13 MED ORDER — HYDRALAZINE HCL 20 MG/ML IJ SOLN
5.0000 mg | Freq: Three times a day (TID) | INTRAMUSCULAR | Status: DC | PRN
Start: 2015-05-13 — End: 2015-05-14

## 2015-05-13 MED ORDER — ONDANSETRON HCL 4 MG/2ML IJ SOLN
4.0000 mg | Freq: Once | INTRAMUSCULAR | Status: AC
  Administered 2015-05-13: 4 mg via INTRAVENOUS
  Filled 2015-05-13: qty 2

## 2015-05-13 MED ORDER — SODIUM CHLORIDE 0.9 % IJ SOLN
3.0000 mL | Freq: Two times a day (BID) | INTRAMUSCULAR | Status: DC
  Administered 2015-05-14: 3 mL via INTRAVENOUS

## 2015-05-13 MED ORDER — ONDANSETRON HCL 4 MG/2ML IJ SOLN: 4.0000 mg | Freq: Four times a day (QID) | INTRAMUSCULAR | Status: DC | PRN

## 2015-05-13 MED ORDER — INSULIN ASPART 100 UNIT/ML ~~LOC~~ SOLN
0.0000 [IU] | Freq: Three times a day (TID) | SUBCUTANEOUS | Status: DC
  Administered 2015-05-14 (×2): 2 [IU] via SUBCUTANEOUS

## 2015-05-13 MED ORDER — SODIUM CHLORIDE 0.9 % IV SOLN: INTRAVENOUS | Status: DC

## 2015-05-13 MED ORDER — TRAZODONE HCL 50 MG PO TABS
50.0000 mg | ORAL_TABLET | Freq: Every day | ORAL | Status: DC
  Administered 2015-05-14: 50 mg via ORAL
  Filled 2015-05-13 (×2): qty 1

## 2015-05-13 MED ORDER — ENSURE ENLIVE PO LIQD: 237.0000 mL | Freq: Two times a day (BID) | ORAL | Status: DC

## 2015-05-13 MED ORDER — SODIUM CHLORIDE 0.9 % IV SOLN
INTRAVENOUS | Status: DC
  Administered 2015-05-13 – 2015-05-14 (×3): via INTRAVENOUS

## 2015-05-13 MED ORDER — ALUM & MAG HYDROXIDE-SIMETH 200-200-20 MG/5ML PO SUSP: 30.0000 mL | Freq: Four times a day (QID) | ORAL | Status: DC | PRN

## 2015-05-13 MED ORDER — LEVOTHYROXINE SODIUM 125 MCG PO TABS
125.0000 ug | ORAL_TABLET | Freq: Every day | ORAL | Status: DC
  Administered 2015-05-14: 125 ug via ORAL
  Filled 2015-05-13: qty 1

## 2015-05-13 MED ORDER — IOHEXOL 300 MG/ML  SOLN
25.0000 mL | INTRAMUSCULAR | Status: DC
  Administered 2015-05-13: 25 mL via ORAL

## 2015-05-13 MED ORDER — SODIUM CHLORIDE 0.9 % IV BOLUS (SEPSIS)
500.0000 mL | Freq: Once | INTRAVENOUS | Status: AC
  Administered 2015-05-13: 500 mL via INTRAVENOUS

## 2015-05-13 NOTE — ED Notes (Signed)
MD Docherty at the bedside.  

## 2015-05-13 NOTE — H&P (Signed)
[facility] Admission History and Physical Service Pager: [phone]  Patient name: Marissa Ballard Medical record number: [id number] Date of birth: 11-Sep-1934 Age: 81 y.o. Gender: female  Primary Care Provider: No PCP Per Patient Consultants: none Code Status: FULL (discussed on admission)  Chief Complaint: abdominal pain, nausea, vomiting, diarrhea  Assessment and Plan: Marissa Ballard is a 81 y.o. female presenting with nausea, vomiting, dehydration. PMH is significant for T2DM, HTN, Hypothyroidism  AKI: Cr 0.97 in March 2016.  Cr 1.58, LA 3.44  in ED.  Recent addition of Metformin and Lisinopril.  Patient with chronic diarrhea since March.  Elevation in Cr could be explained by either medication.  Lactic acidosis could also be 2/2 Metformin.  DDx medication induced AKI vs AKI 2/2 diarrhea/dehydration.   -Place in observation under Dr Erin Hearing -Telemetry -VS per floor protocol -IVFs, Clear liquids -Repeat BMET in am -Trend lactic acid -Will hold Lisinopril and Metformin  Abdominal pain/nausea/vomiting/diarrhea: question viral illness superimposed on diarrhea from recent initiation of Metformin.  Abdominal exam benign.  CT abd negative for acute processes, Lipase nml, slight leukocytosis WBC 11.0 -Zofran PRN nausea -Clears, ADAT -IVFs -Considered PPI but no abdominal pain and very low suspicion for peptic ulcer  Hematochezia: FOBT+ in ED.  Has been having chronic diarrhea.  Suspect that bleeding is 2/2 diarrhea vs hemorrhoid (recent constipation) Hgb 13.4, stable from march labs (though suspect some level of hemoconcentration).  Currently stable.  Patient reports normal colonoscopy "years ago".  Though no record can be found in the EMR. -Repeat CBC in am -Would recommend possible outpatient colonoscopy if continued GI bleed -Consider c/s GI in am if patient continues with frank bloody stools -Consider rectal exam   Dysuria: Afebrile. Has been going on  for >1 year.  UA with hgb and >300 protein. -Urine culture pending -Will not start antibiotics just yet, as this seems to be a chronic problem and urine does not appear grossly infected. -Would recommend repeating UA outpatient once AKI has resolved.  If continued blood in urine and negative Ucx, would consider referral to Urology for further evaluation  HTN: Started Lisinopril in March of this year.  -Hold home Lisinopril in the setting of AKI -Hydralazine PRN SBP>180, DBP>100  Hypothyroidism: Last TSH 1.449 (01/2015) -Continue home synthroid  T2DM: Last A1c 7.0.  On Metformin at home -Hold home metformin -Sensitive SSI w/ CBG monitoring  FEN/GI: MIVFs, Clears, ADAT to HH/Carb mod diet Prophylaxis: SCDs in setting of possible GI bleed  Disposition: Place in observation for IVFs and monitoring of CBC  History of Present Illness: Marissa Ballard is a 81 y.o. female presenting with nausea, vomiting, diarrhea  Patient reports that she has had chronic diarrhea since being started on Metformin in March of this year.  She reports that diarrhea had become constipation once her dose was cut in half.  She has been taking 1.5 tabs daily now and stools have gotten softer.  She reports that she has had now 1 day of diarrhea and hematochezia.  She reports that she has stooled at least 8 times daily with a few episodes of NBNB vomiting earlier today.  She currently does not feel nauseated.  She reports abdominal pain has improved.  She denies any sick contacts, post prandial abdominal pain, recent travel or undercooked foods.  She has not been on any antibiotics lately.  She states that her last colonoscopy was normal, though she cannot exactly recall when it was.  She reports  that she hydrates well throughout the day and has not used any NSAIDs lately.  She reports very seldom use of baby asprin for arthritis.  She reports pain with urination >1 year but no fevers, back pain, hematuria.  Review Of  Systems: Per HPI with the following additions: none Otherwise 12 point review of systems was performed and was unremarkable.  Patient Active Problem List   Diagnosis Date Noted  . Atrophic vaginitis 03/03/2014  . DIABETES MELLITUS, TYPE II, UNCONTROLLED 10/06/2008  . HYPOTHYROIDISM 01/22/2008  . OBESITY 01/22/2008   Past Medical History: Past Medical History  Diagnosis Date  . Diabetes   . Thyroid disease    Past Surgical History: Past Surgical History  Procedure Laterality Date  . Appendectomy  1952  . Abdominal hysterectomy  1972   Social History: History  Substance Use Topics  . Smoking status: Never Smoker   . Smokeless tobacco: Not on file  . Alcohol Use: No   Additional social history: none  Please also refer to relevant sections of EMR.  Family History: Family History  Problem Relation Age of Onset  . Breast cancer Mother   . Heart disease Mother   . Heart attack Father 35   Allergies and Medications: No Known Allergies No current facility-administered medications on file prior to encounter.   Current Outpatient Prescriptions on File Prior to Encounter  Medication Sig Dispense Refill  . levothyroxine (SYNTHROID, LEVOTHROID) 125 MCG tablet Take 1 tablet (125 mcg total) by mouth daily before breakfast. 90 tablet 3  . lisinopril (PRINIVIL,ZESTRIL) 20 MG tablet Take 1 tablet (20 mg total) by mouth daily. 90 tablet 3  . metFORMIN (GLUCOPHAGE) 1000 MG tablet TAKE ONE TABLET BY MOUTH TWICE DAILY WITH MEALS 180 tablet 0    Objective: BP 168/82 mmHg  Pulse 70  Temp(Src) 97.7 F (36.5 C) (Oral)  Resp 26  SpO2 96% Exam: General: awake, alert, well appearing, pleasant female, NAD Eyes: EOMI ENTM: poor dentition, mildly dry MM Cardiovascular: RRR with an occasional skipped beat, no murmurs, cap refill slightly delayed Respiratory: CTAB, no increased WOB, no wheeze, breathing well on RA Abdomen: obese, soft, NT/ND, +BS, no peritoneal signs MSK: moves  extremities independently, no edema Skin: no rashes Neuro: follows commands, no focal deficits Psych: mood stable, speech normal, good eye contact  Labs and Imaging: CBC BMET   Recent Labs Lab 05/13/15 1212  WBC 11.0*  HGB 13.4  HCT 41.4  PLT 223    Recent Labs Lab 05/13/15 1212  NA 137  K 4.7  CL 105  CO2 19*  BUN 28*  CREATININE 1.59*  GLUCOSE 237*  CALCIUM 9.3     Lipase: 41 Lactic Acid: 3.44 UCx pending CDiff pending  Urinalysis    Component Value Date/Time   COLORURINE YELLOW 05/13/2015 1340   APPEARANCEUR CLEAR 05/13/2015 1340   LABSPEC 1.013 05/13/2015 1340   PHURINE 5.5 05/13/2015 1340   GLUCOSEU NEGATIVE 05/13/2015 1340   HGBUR SMALL* 05/13/2015 1340   BILIRUBINUR NEGATIVE 05/13/2015 1340   KETONESUR NEGATIVE 05/13/2015 1340   PROTEINUR >300* 05/13/2015 1340   UROBILINOGEN 0.2 05/13/2015 1340   NITRITE NEGATIVE 05/13/2015 1340   LEUKOCYTESUR NEGATIVE 05/13/2015 1340      Ct Abdomen Pelvis Wo Contrast  05/13/2015   CLINICAL DATA:  Abdominal pain.  Nausea vomiting and diarrhea.  EXAM: CT ABDOMEN AND PELVIS WITHOUT CONTRAST  TECHNIQUE: Multidetector CT imaging of the abdomen and pelvis was performed following the standard protocol without IV contrast.  COMPARISON:  None  FINDINGS: Lower chest: The lung bases are clear. No pleural or pericardial effusion.  Hepatobiliary: There is no suspicious liver abnormality. The gallbladder is within normal limits. No biliary dilatation.  Pancreas: Negative  Spleen: Normal appearance of the spleen.  Adrenals/Urinary Tract: The adrenal glands are both normal. The right kidney is normal. Normal appearance of the left kidney. The urinary bladder is within normal limits.  Stomach/Bowel: The stomach is within normal limits. The small bowel loops have a normal course and caliber. No obstruction. Normal appearance of the colon.  Vascular/Lymphatic: Calcified atherosclerotic disease involves the abdominal aorta. No aneurysm. No  enlarged retroperitoneal or mesenteric adenopathy. No enlarged pelvic or inguinal lymph nodes.  Reproductive: Previous hysterectomy.  No adnexal mass.  Other: There is no ascites or focal fluid collections within the abdomen or pelvis.  Musculoskeletal: Degenerative disc disease is identified within the lumbar spine. Most advanced at L3-4 and L4-5. Bilateral facet hypertrophy and degenerative change noted.  IMPRESSION: 1. No acute findings within the abdomen or pelvis. 2. Aortic atherosclerosis. 3. Degenerative disc disease.   Electronically Signed   By: Kerby Moors M.D.   On: 05/13/2015 17:22   Janora Norlander, DO 05/13/2015, 6:02 PM PGY-1, [place] Intern pager: [phone], text pages welcome  I have read and agree with the amended note as above Phill Myron, MD, PGY-2 8:32 PM

## 2015-05-13 NOTE — Progress Notes (Signed)
Received report via phone on pt from Jarrett Soho, ED nurse.

## 2015-05-13 NOTE — Progress Notes (Signed)
Family medicine paged and notified of pt's CBG 206, critical lab lactic acid of 3.3. Pt also requested something for sleep. Family medicine MD called back, no order for insulin at the moment, will place order for sleep medication.

## 2015-05-13 NOTE — ED Notes (Signed)
Phlebotomy at the bedside  

## 2015-05-13 NOTE — ED Notes (Signed)
Pt placed on contact enteric precautions.

## 2015-05-13 NOTE — ED Provider Notes (Signed)
CSN: [id number]     Arrival date & time 05/13/15  1132 History   First MD Initiated Contact with Patient 05/13/15 1136     Chief Complaint  Patient presents with  . Abdominal Pain     (Consider location/radiation/quality/duration/timing/severity/associated sxs/prior Treatment) Patient is a 81 y.o. female presenting with abdominal pain. The history is provided by the patient. No language interpreter was used.  Abdominal Pain Pain location:  LLQ and RLQ Pain quality: aching   Pain radiates to:  Does not radiate Pain severity:  Moderate Onset quality:  Sudden Duration: 9am. Timing:  Intermittent Progression:  Waxing and waning Chronicity:  New Relieved by:  Vomiting Ineffective treatments:  None tried Associated symptoms: anorexia, diarrhea, nausea and vomiting   Associated symptoms: no chest pain, no chills, no cough, no dysuria, no fatigue, no fever, no shortness of breath and no sore throat   Associated symptoms comment:  Diaphoresis Diarrhea:    Quality:  Semi-solid (now in ED watery,blood tinged)   Number of occurrences:  6   Severity:  Moderate   Duration:  1 day   Timing:  Constant   Progression:  Unchanged Vomiting:    Quality:  Stomach contents   Number of occurrences:  3   Duration:  1 day   Timing:  Intermittent   Progression:  Resolved Risk factors: being elderly     Past Medical History  Diagnosis Date  . Diabetes   . Thyroid disease    Past Surgical History  Procedure Laterality Date  . Appendectomy  1952  . Abdominal hysterectomy  1972   Family History  Problem Relation Age of Onset  . Breast cancer Mother   . Heart disease Mother   . Heart attack Father 91   History  Substance Use Topics  . Smoking status: Never Smoker   . Smokeless tobacco: Not on file  . Alcohol Use: No   OB History    No data available     Review of Systems  Constitutional: Negative for fever, chills, diaphoresis, activity change, appetite change and fatigue.   HENT: Negative for congestion, facial swelling, rhinorrhea and sore throat.   Eyes: Negative for photophobia and discharge.  Respiratory: Negative for cough, chest tightness and shortness of breath.   Cardiovascular: Negative for chest pain, palpitations and leg swelling.  Gastrointestinal: Positive for nausea, vomiting, abdominal pain, diarrhea and anorexia.  Endocrine: Negative for polydipsia and polyuria.  Genitourinary: Negative for dysuria, frequency, difficulty urinating and pelvic pain.  Musculoskeletal: Negative for back pain, arthralgias, neck pain and neck stiffness.  Skin: Negative for color change and wound.  Allergic/Immunologic: Negative for immunocompromised state.  Neurological: Negative for facial asymmetry, weakness, numbness and headaches.  Hematological: Does not bruise/bleed easily.  Psychiatric/Behavioral: Negative for confusion and agitation.      Allergies  Review of patient's allergies indicates no known allergies.  Home Medications   Prior to Admission medications   Medication Sig Start Date End Date Taking? Authorizing Provider  levothyroxine (SYNTHROID, LEVOTHROID) 125 MCG tablet Take 1 tablet (125 mcg total) by mouth daily before breakfast. 02/04/15  Yes Robyn Haber, MD  lisinopril (PRINIVIL,ZESTRIL) 20 MG tablet Take 1 tablet (20 mg total) by mouth daily. 02/04/15  Yes Robyn Haber, MD  metFORMIN (GLUCOPHAGE) 1000 MG tablet TAKE ONE TABLET BY MOUTH TWICE DAILY WITH MEALS 02/09/15  Yes Robyn Haber, MD   BP 168/82 mmHg  Pulse 70  Temp(Src) 97.7 F (36.5 C) (Oral)  Resp 26  SpO2 96% Physical Exam  Constitutional: She is oriented to person, place, and time. She appears well-developed and well-nourished. No distress.  HENT:  Head: Normocephalic and atraumatic.  Mouth/Throat: No oropharyngeal exudate.  Eyes: Pupils are equal, round, and reactive to light.  Neck: Normal range of motion. Neck supple.  Cardiovascular: Normal rate, regular rhythm  and normal heart sounds.  Exam reveals no gallop and no friction rub.   No murmur heard. Pulmonary/Chest: Effort normal and breath sounds normal. No respiratory distress. She has no wheezes. She has no rales.  Abdominal: Soft. Bowel sounds are normal. She exhibits no distension and no mass. There is no tenderness. There is no rebound and no guarding.  Musculoskeletal: Normal range of motion. She exhibits no edema or tenderness.  Neurological: She is alert and oriented to person, place, and time.  Skin: Skin is warm and dry.  Psychiatric: She has a normal mood and affect.    ED Course  Procedures (including critical care time) Labs Review Labs Reviewed  CBC WITH DIFFERENTIAL/PLATELET - Abnormal; Notable for the following:    WBC 11.0 (*)    Neutro Abs 8.5 (*)    All other components within normal limits  COMPREHENSIVE METABOLIC PANEL - Abnormal; Notable for the following:    CO2 19 (*)    Glucose, Bld 237 (*)    BUN 28 (*)    Creatinine, Ser 1.59 (*)    GFR calc non Af Amer 30 (*)    GFR calc Af Amer 35 (*)    All other components within normal limits  URINALYSIS, ROUTINE W REFLEX MICROSCOPIC (NOT AT [facility]) - Abnormal; Notable for the following:    Hgb urine dipstick SMALL (*)    Protein, ur >300 (*)    All other components within normal limits  URINE MICROSCOPIC-ADD ON - Abnormal; Notable for the following:    Squamous Epithelial / LPF FEW (*)    All other components within normal limits  I-STAT CG4 LACTIC ACID, ED - Abnormal; Notable for the following:    Lactic Acid, Venous 3.44 (*)    All other components within normal limits  POC OCCULT BLOOD, ED - Abnormal; Notable for the following:    Fecal Occult Bld POSITIVE (*)    All other components within normal limits  URINE CULTURE  CLOSTRIDIUM DIFFICILE BY PCR (NOT AT [facility])  LIPASE, BLOOD  I-STAT TROPOININ, ED    Imaging Review Ct Abdomen Pelvis Wo Contrast  05/13/2015   CLINICAL DATA:  Abdominal pain.  Nausea vomiting  and diarrhea.  EXAM: CT ABDOMEN AND PELVIS WITHOUT CONTRAST  TECHNIQUE: Multidetector CT imaging of the abdomen and pelvis was performed following the standard protocol without IV contrast.  COMPARISON:  None  FINDINGS: Lower chest: The lung bases are clear. No pleural or pericardial effusion.  Hepatobiliary: There is no suspicious liver abnormality. The gallbladder is within normal limits. No biliary dilatation.  Pancreas: Negative  Spleen: Normal appearance of the spleen.  Adrenals/Urinary Tract: The adrenal glands are both normal. The right kidney is normal. Normal appearance of the left kidney. The urinary bladder is within normal limits.  Stomach/Bowel: The stomach is within normal limits. The small bowel loops have a normal course and caliber. No obstruction. Normal appearance of the colon.  Vascular/Lymphatic: Calcified atherosclerotic disease involves the abdominal aorta. No aneurysm. No enlarged retroperitoneal or mesenteric adenopathy. No enlarged pelvic or inguinal lymph nodes.  Reproductive: Previous hysterectomy.  No adnexal mass.  Other: There is no ascites or focal fluid collections within the  abdomen or pelvis.  Musculoskeletal: Degenerative disc disease is identified within the lumbar spine. Most advanced at L3-4 and L4-5. Bilateral facet hypertrophy and degenerative change noted.  IMPRESSION: 1. No acute findings within the abdomen or pelvis. 2. Aortic atherosclerosis. 3. Degenerative disc disease.   Electronically Signed   By: Kerby Moors M.D.   On: 05/13/2015 17:22     EKG Interpretation   Date/Time:  Thursday May 13 2015 11:32:55 EDT Ventricular Rate:  59 PR Interval:  180 QRS Duration: 99 QT Interval:  443 QTC Calculation: 439 R Axis:   6 Text Interpretation:  Sinus rhythm Nonspecific T abnormalities, lateral  leads No prior for comparison Confirmed by DOCHERTY  MD, MEGAN [phone]) on  05/13/2015 1:11:30 PM      MDM   Final diagnoses:  Lactic acidosis  Acute renal  insufficiency  Nausea vomiting and diarrhea  Lower abdominal pain  Leukocytosis    Pt is a 81 y.o. female with Pmhx as above who presents with onset of lower abdominal pain, nausea, vomiting and diarrhea with diaphoresis this morning around 9 AM.  On my exam.  She states that her symptoms are much better and she has no current abdominal pain or nausea.  She's had no recent illnesses or injuries.  She denies chest pain or shortness of breath, as well as fevers, though she has had chills.  She had an episode last night or she felt like she couldn't empty her bladder, but has not had that since.  On physical exam vitals are stable.  She is in no acute distress.  She had an isolated hypoxia on O2 sat and was asymptomatic.  As above, initially, patient was very comfortable.  However, when to 2 hours into ED visit, she began having a recurrence of her low abdominal pain and profuse watery, blood-tinged diarrhea.  Clostridium difficile is been sent.  Lactate is elevated at 3.44.  She has a leukocytosis and a mild creatinine elevation.  CT abdomen pelvis nml. Cl diff pending, however given AKI, CR and LA elevation, age, will pursue medical admit for observation. Ernestina Patches, MD 05/13/15 [phone]

## 2015-05-13 NOTE — ED Notes (Signed)
Pt reports to the ED for eval of abd pain, N/V/D, and generalized weakness. She reports she has been feeling unwell x 2-3 days but the N/V/D started today. Pt denies any hematemesis or blood in her stools. Endorses dysuria and lower abd pain as well. 12 lead unremarkable en route. Denies any CP or SOB. CBG 230 mg/dl. She does take Metformin. Pt also reports feeling "hot." Pt A&Ox4, resp e/u, and skin warm and dry.

## 2015-05-13 NOTE — ED Notes (Signed)
Patient returned from CT

## 2015-05-13 NOTE — Progress Notes (Signed)
Pt arrived to 5W06, transferred from ED. A&O x4, VS stable with exception of high BP. Complains of pain 3/10 on lower abdomen. No pressure ulcer noted on sacral area. Pt oriented to room and equipment. Tele placed. Pt guide booklet was given to pt. Pt refused to be on SCDs.

## 2015-05-13 NOTE — ED Notes (Signed)
Pt states that she believes her O2 sats are normally low, though she doesn't know why. Placed on 2L/min via nasal cannula.

## 2015-05-14 DIAGNOSIS — E872 Acidosis: Secondary | ICD-10-CM

## 2015-05-14 DIAGNOSIS — N179 Acute kidney failure, unspecified: Secondary | ICD-10-CM

## 2015-05-14 DIAGNOSIS — D72829 Elevated white blood cell count, unspecified: Secondary | ICD-10-CM | POA: Insufficient documentation

## 2015-05-14 DIAGNOSIS — R197 Diarrhea, unspecified: Secondary | ICD-10-CM | POA: Diagnosis not present

## 2015-05-14 DIAGNOSIS — R112 Nausea with vomiting, unspecified: Secondary | ICD-10-CM | POA: Insufficient documentation

## 2015-05-14 DIAGNOSIS — I10 Essential (primary) hypertension: Secondary | ICD-10-CM | POA: Insufficient documentation

## 2015-05-14 LAB — CBC
HEMATOCRIT: 42.8 % (ref 36.0–46.0)
HEMOGLOBIN: 14.1 g/dL (ref 12.0–15.0)
MCH: 27.2 pg (ref 26.0–34.0)
MCHC: 32.9 g/dL (ref 30.0–36.0)
MCV: 82.5 fL (ref 78.0–100.0)
PLATELETS: 230 10*3/uL (ref 150–400)
RBC: 5.19 MIL/uL — ABNORMAL HIGH (ref 3.87–5.11)
RDW: 15.4 % (ref 11.5–15.5)
WBC: 11.4 10*3/uL — AB (ref 4.0–10.5)

## 2015-05-14 LAB — LACTIC ACID, PLASMA: Lactic Acid, Venous: 1.9 mmol/L (ref 0.5–2.0)

## 2015-05-14 LAB — BASIC METABOLIC PANEL
Anion gap: 10 (ref 5–15)
BUN: 28 mg/dL — ABNORMAL HIGH (ref 6–20)
CALCIUM: 7.7 mg/dL — AB (ref 8.9–10.3)
CHLORIDE: 107 mmol/L (ref 101–111)
CO2: 19 mmol/L — AB (ref 22–32)
CREATININE: 1.24 mg/dL — AB (ref 0.44–1.00)
GFR calc Af Amer: 47 mL/min — ABNORMAL LOW (ref >60)
GFR calc non Af Amer: 41 mL/min — ABNORMAL LOW (ref >60)
Glucose, Bld: 196 mg/dL — ABNORMAL HIGH (ref 65–99)
Potassium: 5 mmol/L (ref 3.5–5.1)
Sodium: 136 mmol/L (ref 135–145)

## 2015-05-14 LAB — GLUCOSE, CAPILLARY
Glucose-Capillary: 192 mg/dL — ABNORMAL HIGH (ref 65–99)
Glucose-Capillary: 192 mg/dL — ABNORMAL HIGH (ref 65–99)

## 2015-05-14 MED ORDER — AMLODIPINE BESYLATE 10 MG PO TABS: 10.0000 mg | ORAL_TABLET | Freq: Every day | ORAL | Status: AC

## 2015-05-14 MED ORDER — GI COCKTAIL ~~LOC~~
30.0000 mL | Freq: Three times a day (TID) | ORAL | Status: DC | PRN
  Filled 2015-05-14: qty 30

## 2015-05-14 NOTE — Progress Notes (Signed)
Utilization Review completed. Rennie Hack RN BSN CM 

## 2015-05-14 NOTE — Discharge Summary (Signed)
[facility] Discharge Summary  Patient name: Marissa Ballard Medical record number: [id number] Date of birth: [birth year] Age: 81 y.o. Gender: female Date of Admission: 05/13/2015  Date of Discharge: 05/20/15 Admitting Physician: Lind Covert, MD  Primary Care Provider: No PCP Per Patient Consultants: None  Indication for Hospitalization: abdominal pain, nausea/vomiting/diarrhea  Discharge Diagnoses/Problem List:  1. AKI 2. Abdominal pain, nausea/vomiting/diarrhea 3. Hematochezia 4. Dysuria 5. Hypothyroidism 6. Type II diabetes mellitus   Disposition: Home  Discharge Condition: Stable  Discharge Exam:  General: well-nourished, well-developed female lying in bed in mild distress ENTM: poor dentition, dry MM with cracking of lips Cardiovascular: RRR, no murmurs/rubs/gallops Respiratory: lungs CTAB, no wheezes Abdomen: soft, tender to palpation in bilateral lower quadrants, +BS, non-distended Extremities: no edema, moving all extremities independently Neuro: A&Ox4, no gross neuro deficits  Brief Hospital Course:  Ms. Maqueda arrived to the ED after 1 day of diarrhea and reported hematochezia. She said that she has had diarrhea since beginning metformin three months ago. She reported 8 episodes of diarrhea and about three episodes of vomiting prior to arrival at ED. Her abdominal pain and nausea improved while in the ED, but blood work showed increased Cr and LA suggestive of AKI. As such, she was admitted for observation. The patient was given IVF and her home lisinopril and metformin were held. An FOBT performed in the ED was positive, though no blood was noted in her diarrhea or vomitus during admittance.  She reported three episodes of watery diarrhea without blood the following morning, with increased abdominal pain in the lower quadrants. Of note, she reported differing severity of symptoms to myself and two other providers during about a two hour  period. However, when I spoke with her later in the day, she reported that the pain quickly had resolved that morning with no intervention. By the afternoon she reported that her symptoms had resolved entirely and she felt well. Her stool was negative for C.diff, and her Cr and LA continued to trend downward. I spoke with her daughter-in-law concerning her mental and physical status, and her daughter said neither she nor the patient's son had concerns about sending her home. Since her symptoms had improved and all tests were negative, the patient was discharged home. She was encouraged to follow-up with her PCP in a week or less if possible.   Issues for Follow Up:  1. Patient's metformin was stopped given potential cause for GI upset. We did not resume her metformin, and encouraged her to see her PCP within a week to determine an updated long-term diabetic treatment regimen.  2. Patient's lisinopril was stopped since patient reported GI symptoms after beginning lisinopril. We started Norvasc instead.  3. Given patient's positive FOBT in ED, consider repeating after diarrhea has resolved.  4. Consider repeat labs to follow Cr.   Significant Procedures: none  Significant Labs and Imaging:  -Stool culture negative for C.diff -Lactic acid 3.44 on arrival, improved to 1.9 before discharge -Cr 1.59 on arrival, improved to 1.24 before discharge  Recent Labs Lab 05/13/15 1212 05/14/15 0439  WBC 11.0* 11.4*  HGB 13.4 14.1  HCT 41.4 42.8  PLT 223 230    Recent Labs Lab 05/13/15 1212 05/14/15 0439  NA 137 136  K 4.7 5.0  CL 105 107  CO2 19* 19*  GLUCOSE 237* 196*  BUN 28* 28*  CREATININE 1.59* 1.24*  CALCIUM 9.3 7.7*  ALKPHOS 60  --   AST 36  --  ALT 22  --   ALBUMIN 4.0  --    Results/Tests Pending at Time of Discharge: None  Discharge Medications:    Medication List    ASK your doctor about these medications        levothyroxine 125 MCG tablet  Commonly known as:   SYNTHROID, LEVOTHROID  Take 1 tablet (125 mcg total) by mouth daily before breakfast.     lisinopril 20 MG tablet  Commonly known as:  PRINIVIL,ZESTRIL  Take 1 tablet (20 mg total) by mouth daily.     metFORMIN 1000 MG tablet  Commonly known as:  GLUCOPHAGE  TAKE ONE TABLET BY MOUTH TWICE DAILY WITH MEALS        Discharge Instructions: Please refer to Patient Instructions section of EMR for full details.   Follow-up Appointments: Patient to make appointment with her PCP (Dr. Verline Lema).    Erin Sons, MD 05/14/2015, 2:23 PM PGY-1, [place]

## 2015-05-14 NOTE — Progress Notes (Signed)
Given patient's inconsistencies in both symptoms and medical history when speaking to different providers, we felt it necessary to speak to patient's family regarding patient's typical behavior. The patient's daughter-in-law Shauna Hugh) said that the patient is normally very "sharp" and her behavior sounded unusual. Patient's son Rush Landmark and daughter-in-law Shauna Hugh can be contacted at [phone] (cell) or [phone] (home) if necessary.

## 2015-05-14 NOTE — Progress Notes (Signed)
Inpatient Diabetes Program Recommendations  AACE/ADA: New Consensus Statement on Inpatient Glycemic Control (2013)  Target Ranges:  Prepandial:   less than 140 mg/dL      Peak postprandial:   less than 180 mg/dL (1-2 hours)      Critically ill patients:  140 - 180 mg/dL   Review of Glycemic control:  Results for SERYNA, MAREK (MRN [id number]) as of 05/14/2015 14:13  Ref. Range 05/13/2015 20:51 05/14/2015 07:33 05/14/2015 11:51  Glucose-Capillary Latest Ref Range: 65-99 mg/dL 206 (H) 192 (H) 192 (H)   Consider increasing Novolog correction to moderate tid with meals.  Thanks, Adah Perl, RN, BC-ADM Inpatient Diabetes Coordinator Pager [phone] (8a-5p)

## 2015-05-14 NOTE — Progress Notes (Signed)
Pt. Received discharge instructions with daughter in law at bedside. Educated pt. On follow-up appointments and the importance of taking metformin with/after meals. Removed IV. No new skin issues noted. All questions answered. No further needs noted at this time.

## 2015-05-14 NOTE — Progress Notes (Signed)
Nutrition Brief Note  Patient identified on the Malnutrition Screening Tool (MST) Report  Wt Readings from Last 15 Encounters:  05/13/15 177 lb 11.1 oz (80.6 kg)  02/04/15 176 lb 6.4 oz (80.015 kg)  03/03/14 192 lb (87.091 kg)  11/28/13 205 lb (92.987 kg)  03/21/13 202 lb (91.627 kg)  04/12/10 208 lb (94.348 kg)  06/17/09 199 lb (90.266 kg)  03/23/09 203 lb (92.08 kg)  10/06/08 212 lb (96.163 kg)  01/22/08 208 lb (94.348 kg)   Marissa Ballard is a 81 y.o. female presenting with nausea, vomiting, dehydration. PMH is significant for T2DM, HTN, Hypothyroidism.   Spoke with pt at bedside. She reports weight loss is intentional and she was losing weight PTA by decreasing portions of food in her diet.  She reports having a lot of nausea and abdominal pain at time of visit. However, she reports appetite was good prior to hospitalization. She ate very little of her breakfast tray this morning ("they gave me peaches and oatmeal- those foods make you poop"). Pt does not appear malnourished, however, refused nutrition-focused physical exam. Pt abruptly ended conversation with RD saying "I'm sorry, honey, but I just can't do this right now".   Body mass index is 32.49 kg/(m^2). Patient meets criteria for obesity, class I based on current BMI.   Current diet order is Heart Healthy, patient is consuming approximately 0% of meals at this time. Labs and medications reviewed.   No nutrition interventions warranted at this time. If nutrition issues arise, please consult RD.   Koji Niehoff A. Jimmye Norman, RD, LDN, CDE Pager: [phone] After hours Pager: [phone]

## 2015-05-14 NOTE — Progress Notes (Signed)
Family Medicine Teaching Service Daily Progress Note Intern Pager: [phone]  Patient name: Marissa Ballard Medical record number: [id number] Date of birth: October 15, 1934 Age: 81 y.o. Gender: female  Primary Care Provider: No PCP Per Patient Consultants: None Code Status: FULL  Assessment and Plan:  [name] is a 81 yo F with PMH of hypothyroidism, HTN, and Type II DM presenting with abdominal pain and N/V/D x 1 month.  AKI: Recent addition of metformin and lisinopril four months ago. Cr improved from 1.58 on arrival to 1.24 today. LA improved from 3.44 on arrival to 1.9.  -D/c telemetry given negative troponins and normal EKG -VS per floor protocol -IVFs, clear liquids -Trend lactic acid -Trend Cr -Continue to hold lisinopril and metformin  Abdominal pain, N/V/D: Pain worsened this morning with multiple episodes of diarrhea. Question of viral gastroenteritis vs medication-induced. C. Diff negative. CT abd negative, lipase WNL, leukocytosis WBC 11.4 -PRN GI cocktail -IVFs -Zofran PRN nausea  Hematochezia: FOBT positive in ED. Suspect bleeding is secondary to diarrhea or hemorrhoids given self-reported recent constipation. Hgb 14. Patient reports normal colonoscopy in past but does not know date of procedure. No records found in EMR.  -Consider outpatient repeat FOBT once diarrhea has resolved -Recommend outpatient colonoscopy if continued GI bleed   Dysuria: Remains afebrile. Chronic x 1 yr. UA with Hgb and >300 protein. -Urine cultures pending -Will not start antibiotics as urine does not appear infected and problem is chronic -Recommend outpatient repeat UA once AKI has resolved -Consider urology referral if continued hematuria with negative urine cultures  HTN: Started on lisinopril four months ago (March 2016) -Hold home lisinopril in setting of AKI -PRN hydralazine if systolid >322 or diastolic >025  Hypothyroidism: Last TSH 1.449 (March 2016) -Continue home  synthroid  Type 2 DM: Started on Metformin four months ago (March 2016). Patient reports this is when diarrhea began. Last A1C 7.0. -Hold home metformin -Sensitive SSI with continued CBG monitoring  FEN/GI: ADAT to HH/Carb mod diet PPx: SCDs  Disposition: remain on floor to continue to trend Cr and LA  Subjective:  General: endorses pain increased from admission Abdominal: endorses persistent dull pain in lower abdominal region GI: endorses continued N/D/V Respiratory:denies difficulty breathing Cardiac: denies chest pain  Objective: Temp:  [97.9 F (36.6 C)-98.2 F (36.8 C)] 97.9 F (36.6 C) (07/01 0558) Pulse Rate:  [60-72] 68 (07/01 0558) Resp:  [18-29] 18 (07/01 0558) BP: (155-191)/(64-108) 171/99 mmHg (07/01 0558) SpO2:  [90 %-100 %] 100 % (07/01 0558) Weight:  [177 lb 11.1 oz (80.6 kg)] 177 lb 11.1 oz (80.6 kg) (06/30 1934)  Physical Exam: General: well-nourished, well-developed female lying in bed in mild distress ENTM: poor dentition, dry MM with cracking of lips Cardiovascular: RRR, no murmurs/rubs/gallops Respiratory: lungs CTAB, no wheezes Abdomen: soft, tender to palpation in bilateral lower quadrants, +BS, non-distended Extremities: no edema, moving all extremities independently Neuro: A&Ox4, no gross neuro deficits  Laboratory:  Recent Labs Lab 05/13/15 1212 05/14/15 0439  WBC 11.0* 11.4*  HGB 13.4 14.1  HCT 41.4 42.8  PLT 223 230    Recent Labs Lab 05/13/15 1212 05/14/15 0439  NA 137 136  K 4.7 5.0  CL 105 107  CO2 19* 19*  BUN 28* 28*  CREATININE 1.59* 1.24*  CALCIUM 9.3 7.7*  PROT 7.4  --   BILITOT 0.5  --   ALKPHOS 60  --   ALT 22  --   AST 36  --   GLUCOSE 237* 196*  Imaging: Ct Abdomen Pelvis Wo Contrast  05/13/2015 CLINICAL DATA: Abdominal pain. Nausea vomiting and diarrhea. EXAM: CT ABDOMEN AND PELVIS WITHOUT CONTRAST TECHNIQUE: Multidetector CT imaging of the abdomen and pelvis was performed following the standard  protocol without IV contrast. COMPARISON: None FINDINGS: Lower chest: The lung bases are clear. No pleural or pericardial effusion. Hepatobiliary: There is no suspicious liver abnormality. The gallbladder is within normal limits. No biliary dilatation. Pancreas: Negative Spleen: Normal appearance of the spleen. Adrenals/Urinary Tract: The adrenal glands are both normal. The right kidney is normal. Normal appearance of the left kidney. The urinary bladder is within normal limits. Stomach/Bowel: The stomach is within normal limits. The small bowel loops have a normal course and caliber. No obstruction. Normal appearance of the colon. Vascular/Lymphatic: Calcified atherosclerotic disease involves the abdominal aorta. No aneurysm. No enlarged retroperitoneal or mesenteric adenopathy. No enlarged pelvic or inguinal lymph nodes. Reproductive: Previous hysterectomy. No adnexal mass. Other: There is no ascites or focal fluid collections within the abdomen or pelvis. Musculoskeletal: Degenerative disc disease is identified within the lumbar spine. Most advanced at L3-4 and L4-5. Bilateral facet hypertrophy and degenerative change noted. IMPRESSION: 1. No acute findings within the abdomen or pelvis. 2. Aortic atherosclerosis. 3. Degenerative disc disease. Electronically Signed By: Kerby Moors M.D. On: 05/13/2015 17:22    Erin Sons, MD 05/14/2015, 1:48 PM PGY-1, [place] Intern pager: [phone], text pages welcome

## 2015-05-14 NOTE — Progress Notes (Signed)
Spoke with patient, she clarified that she does have a PCP. Dr Verline Lema. She states that she drives, and completes ADLs without any problems. Lives in home alone. CM does not anticipate any DC needs at this time, will continue to follow.

## 2015-05-15 LAB — URINE CULTURE

## 2015-07-14 ENCOUNTER — Encounter: Payer: Self-pay | Admitting: Internal Medicine

## 2015-07-16 ENCOUNTER — Encounter: Payer: Self-pay | Admitting: Internal Medicine

## 2015-08-09 ENCOUNTER — Ambulatory Visit (INDEPENDENT_AMBULATORY_CARE_PROVIDER_SITE_OTHER): Payer: Medicare Other | Admitting: Family Medicine

## 2015-08-09 VITALS — BP 130/60 | HR 33 | Temp 97.9°F | Resp 17 | Ht 61.0 in | Wt 188.2 lb

## 2015-08-09 DIAGNOSIS — N3945 Continuous leakage: Secondary | ICD-10-CM

## 2015-08-09 DIAGNOSIS — R350 Frequency of micturition: Secondary | ICD-10-CM

## 2015-08-09 DIAGNOSIS — Z23 Encounter for immunization: Secondary | ICD-10-CM | POA: Diagnosis not present

## 2015-08-09 DIAGNOSIS — N766 Ulceration of vulva: Secondary | ICD-10-CM

## 2015-08-09 DIAGNOSIS — R300 Dysuria: Secondary | ICD-10-CM

## 2015-08-09 DIAGNOSIS — H6523 Chronic serous otitis media, bilateral: Secondary | ICD-10-CM

## 2015-08-09 LAB — POCT URINALYSIS DIP (MANUAL ENTRY)
Bilirubin, UA: NEGATIVE
Glucose, UA: NEGATIVE
Ketones, POC UA: NEGATIVE
Nitrite, UA: NEGATIVE
Protein Ur, POC: 30 — AB
Spec Grav, UA: 1.015
Urobilinogen, UA: 0.2
pH, UA: 5

## 2015-08-09 LAB — POCT WET + KOH PREP
Trich by wet prep: ABSENT
Yeast by KOH: ABSENT
Yeast by wet prep: ABSENT

## 2015-08-09 LAB — POC MICROSCOPIC URINALYSIS (UMFC): Mucus: ABSENT

## 2015-08-09 MED ORDER — SULFAMETHOXAZOLE-TRIMETHOPRIM 800-160 MG PO TABS: 1.0000 | ORAL_TABLET | Freq: Two times a day (BID) | ORAL | Status: DC

## 2015-08-09 NOTE — Progress Notes (Addendum)
This chart was scribed for Marissa Haber, MD by Moises Blood, medical scribe at [facility].The patient was seen in exam room 13 and the patient's care was started at 8:05 PM.  Patient ID: Marissa Ballard MRN: [id number], DOB: 1934-01-22, 81 y.o. Date of Encounter: 08/09/2015  Primary Physician: No PCP Per Patient  Chief Complaint:  Chief Complaint  Patient presents with   Urinary Frequency    x 1 month   burning with urination   Vaginal Discharge    some brown discharge noticed & some blood noticed when wiping   Flu Vaccine   Tinnitus    left side-? wax impaction    HPI:  Marissa Ballard is a 81 y.o. female who presents to [facility] complaining of multiple concerns.  She has noticed increased urinary frequency with dysuria and brown vaginal discharge with some blood for a month now. She says it hurts to sit down. She had a hysterectomy a long time ago.  She had last of her medication in June. She went to the ER, and told them that she has vaginal problems. The nurse said she was red but no blister. It feels like there's something protruding out. That morning, she took metformin, amlodipine and synthroid.   She also received a flu vaccine today.   She works at [employer].   Past Medical History  Diagnosis Date   Diabetes    Thyroid disease      Home Meds: Prior to Admission medications   Medication Sig Start Date End Date Taking? Authorizing Provider  amLODipine (NORVASC) 10 MG tablet Take 1 tablet (10 mg total) by mouth daily. 05/14/15   Vivi Barrack, MD  levothyroxine (SYNTHROID, LEVOTHROID) 125 MCG tablet Take 1 tablet (125 mcg total) by mouth daily before breakfast. 02/04/15   Marissa Haber, MD    Allergies: No Known Allergies  Social History   Social History   Marital Status: Married    Spouse Name: N/A   Number of Children: N/A   Years of Education: N/A   Occupational History   Not on  file.   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      Home Situation: lives with her husband and he requires a lot care as he has dementia and CHF      Spiritual Beliefs:       Lifestyle: active at work, no regular cardiovascular activity, diet - avoid beef and pork, weakness is sweets              Review of Systems: Constitutional: negative for chills, fever, night sweats, weight changes, or fatigue  HEENT: negative for vision changes, congestion, rhinorrhea, ST, epistaxis, or sinus pressure, positive for tinnitus (left)  Cardiovascular: negative for chest pain or palpitations Respiratory: negative for hemoptysis, wheezing, shortness of breath, or cough Abdominal: negative for abdominal pain, nausea, vomiting, diarrhea, or constipation Dermatological: negative for rash Neurologic: negative for headache, dizziness, or syncope GU: positive for urinary frequency, dysuria, vaginal discharge All other systems reviewed and are otherwise negative with the exception to those above and in the  HPI.  Physical Exam: Blood pressure 130/60, pulse 33, temperature 97.9 F (36.6 C), temperature source Oral, resp. rate 17, height 5\' 1"  (1.549 m), weight 188 lb 4 oz (85.39 kg), SpO2 98 %., Body mass index is 35.59 kg/(m^2). General: Well developed, well nourished, in no acute distress. Head: Normocephalic, atraumatic, eyes without discharge, sclera non-icteric, nares are without discharge. Oral cavity moist, posterior pharynx without exudate, erythema, peritonsillar abscess, or post nasal drip. Both TMs show marked serous otitis changes with opacity, scarring, and opaque fluid behind the tympanic membrane. She has decreased hearing  as well.  Neck: Supple. No thyromegaly. Full ROM. No lymphadenopathy. Lungs: Clear bilaterally to auscultation without wheezes, rales, or rhonchi. Breathing is unlabored. Heart: RRR with S1 S2. No murmurs, rubs, or gallops appreciated. Abdomen: Soft, non-tender, non-distended with normoactive bowel sounds. No hepatomegaly. No rebound/guarding. No obvious abdominal masses. Msk:  Strength and tone normal for age. Extremities/Skin: Warm and dry. No clubbing or cyanosis. No edema. No rashes or suspicious lesions. Neuro: Alert and oriented X 3. Moves all extremities spontaneously. Gait is normal. CNII-XII grossly in tact. When getting up quickly, patient became lightheaded and record assistance to get to a chair and get dressed. Psych:  Responds to questions appropriately with a normal affect.  Pelvic exam: External introitus superiorly has significant ulceration with surrounding bright erythema. There is a yellow eschar overlying the ulceration. The vaginal area appears to be normal. Labs:  Results for orders placed or performed in visit on 08/09/15  POCT urinalysis dipstick  Result Value Ref Range   Color, UA yellow yellow   Clarity, UA clear clear   Glucose, UA negative negative   Bilirubin, UA negative negative   Ketones, POC UA negative negative   Spec Grav, UA 1.015    Blood, UA small (A) negative   pH, UA 5.0    Protein Ur, POC =30 (A) negative   Urobilinogen, UA 0.2    Nitrite, UA Negative Negative   Leukocytes, UA large (3+) (A) Negative  POCT Microscopic Urinalysis ([facility])  Result Value Ref Range   WBC,UR,HPF,POC Moderate (A) None WBC/hpf   RBC,UR,HPF,POC Few (A) None RBC/hpf   Bacteria Few (A) None   Mucus Absent Absent   Epithelial Cells, UR Per Microscopy Few (A) None cells/hpf  POCT Wet + KOH Prep ([facility])  Result Value Ref Range   Yeast by KOH Absent Present, Absent   Yeast by wet prep Absent Present, Absent   WBC by wet prep Moderate (A) None, Few   Clue Cells Wet Prep  HPF POC Few (A) None   Trich by wet prep Absent Present, Absent   Bacteria Wet Prep HPF POC Few None, Few   Epithelial Cells By [employer] Pref ([facility]) Few None, Few   RBC,UR,HPF,POC Few (A) None RBC/hpf     ASSESSMENT AND PLAN:  81 y.o. year old female with aggressive appearing ulceration in the vulva which very likely will need to be biopsied and surgically excised. She does have a urinary infection the present time and this will be treated.  This chart was scribed in my presence and reviewed by me personally.    ICD-9-CM ICD-10-CM   1. Urinary frequency 788.41 R35.0 POCT urinalysis dipstick     POCT Microscopic Urinalysis ([facility])     sulfamethoxazole-trimethoprim (BACTRIM DS,SEPTRA DS) 800-160 MG per tablet     Urine culture     CANCELED: POCT UA - Microscopic Only     CANCELED: POCT urinalysis dipstick  2. Burning with  urination 788.1 R30.0 POCT urinalysis dipstick     POCT Microscopic Urinalysis ([facility])     sulfamethoxazole-trimethoprim (BACTRIM DS,SEPTRA DS) 800-160 MG per tablet     Urine culture     CANCELED: POCT UA - Microscopic Only     CANCELED: POCT urinalysis dipstick  3. Vulvar ulceration 616.50 N76.6 POCT Wet + KOH Prep ([facility])     Ambulatory referral to Gynecology  4. Continuous leakage of urine 788.37 N39.45   5. Bilateral chronic serous otitis media 381.10 H65.23 Ambulatory referral to ENT  6. Need for prophylactic vaccination and inoculation against influenza V04.81 Z23 Flu Vaccine QUAD 36+ mos IM    By signing my name below, I, Moises Blood, attest that this documentation has been prepared under the direction and in the presence of Marissa Haber, MD. Electronically Signed: Moises Blood, [place]. 08/09/2015 , 8:43 PM .  Signed, Marissa Haber, MD 08/09/2015 8:43 PM

## 2015-08-09 NOTE — Patient Instructions (Signed)
You do have a urinary infection. I'm calling in antibiotic for this. This should help with the urinary symptoms.  The outside of the vagina does show significant ulceration and very concerned about this. We did a test looking for yeast infection and this was negative. Therefore I'm sending you urgently to gynecology. You should get a call next day or so. If you haven't heard from [place] I Wednesday, would like you to call to find out when your appointment is with Dr. Benjie Karvonen.  Also, he had a significant problem with fluid buildup behind the eardrum. This causes hearing loss and dizziness. For this I'm referring you to

## 2015-08-11 LAB — URINE CULTURE
Colony Count: NO GROWTH
Organism ID, Bacteria: NO GROWTH

## 2015-08-20 ENCOUNTER — Ambulatory Visit: Payer: Medicare Other | Attending: Gynecologic Oncology | Admitting: Gynecologic Oncology

## 2015-08-20 ENCOUNTER — Encounter: Payer: Self-pay | Admitting: Gynecologic Oncology

## 2015-08-20 VITALS — BP 180/60 | HR 58 | Temp 98.1°F | Resp 17 | Ht 61.0 in | Wt 187.7 lb

## 2015-08-20 DIAGNOSIS — D071 Carcinoma in situ of vulva: Secondary | ICD-10-CM | POA: Diagnosis not present

## 2015-08-20 NOTE — Patient Instructions (Signed)
Preparing for your Surgery  Plan for surgery on October 25 with Dr. Denman George.  You will be scheduled for a wide local excision of the vulva.  Pre-operative Testing -You will receive a phone call from presurgical RN at [facility] on [facility].

## 2015-08-20 NOTE — Progress Notes (Signed)
Consult Note: Gyn-Onc  Consult was requested by Dr. Benjie Karvonen for the evaluation of Marissa Ballard 81 y.o. 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 VIN III    New patient    Assessment/Plan:  [name]  is a 81 y.o.  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 I am recommending wide local excision to rule out invasive carcinoma. I discussed with the patient that there may be loss of clitoral function and removal of the clitoris associated with this procedure. I discussed operative risks including risk of wound separation and infection. Her diabetes sounds well controlled. I recommend application of neosporin ointment on incision to abate some of the discomfort from the ulceration.   HPI: [name] is a 81 -year-old woman who is seen in consultation at the request of Dr. Benjie Karvonen for VIN 3. The patient has a history of several months of anterior vulvar pain. She was evaluated by Dr. Benjie Karvonen on 08/03/2015 and was noted to have an anterior vulvar lesion that was ulcerated and very tender. A biopsy was performed of the lesion which revealed VIN 3 with patchy chronic inflammation.  The patient notes discomfort with urination and burning on the skin.  She is otherwise treated for type 2 diabetes mellitus. She reports her last HbA1c was 7%. She is not sexually active. She is a nonsmoker.    Current Meds:  Outpatient Encounter Prescriptions as of 08/20/2015  Medication Sig  . amLODipine (NORVASC) 10 MG tablet Take 1 tablet (10 mg total) by mouth daily.  Marland Kitchen levothyroxine (SYNTHROID, LEVOTHROID) 125 MCG tablet Take 1 tablet (125 mcg total) by mouth daily before breakfast.  . metFORMIN (GLUCOPHAGE) 1000 MG tablet Take 1,000 mg by mouth daily.  Marland Kitchen sulfamethoxazole-trimethoprim (BACTRIM DS,SEPTRA DS) 800-160 MG per tablet Take 1 tablet by mouth 2 (two) times daily.   No facility-administered encounter medications on file as of 08/20/2015.    Allergy: No Known  Allergies  Social Hx:   Social History   Social History  . Marital Status: Married    Spouse Name: N/A  . Number of Children: N/A  . Years of Education: N/A   Occupational History  . Not on file.   Social History Main Topics  . Smoking status: Never Smoker   . Smokeless tobacco: Not on file  . Alcohol Use: No  . Drug Use: Not on file  . Sexual Activity: Not on file   Other Topics Concern  . 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 Appendectomy  1952  . Abdominal hysterectomy  1972    Past Medical Hx:  Past Medical History  Diagnosis Date  . Diabetes ([place])   . Thyroid disease     Past Gynecological History:   No LMP recorded. Patient is postmenopausal.  Family Hx:  Family History  Problem Relation Age of Onset  . Breast cancer Mother   .  Heart disease Mother   . Heart attack Father 67  . Cancer Sister     ? cervical ca  . Cancer Sister     Review of Systems:  Constitutional  Feels well,    ENT Normal appearing ears and nares bilaterally Skin/Breast  No rash, sores, jaundice, itching, dryness Cardiovascular  No chest pain, shortness of breath, or edema  Pulmonary  No cough or wheeze.  Gastro Intestinal  No nausea, vomitting, or diarrhoea. No bright red blood per rectum, no abdominal pain, change in bowel movement, or constipation.  Genito Urinary  No frequency, urgency, +dysuria,  Musculo Skeletal  No myalgia, arthralgia, joint swelling or pain  Neurologic  No weakness, numbness, change in gait,  Psychology  No depression, anxiety, insomnia.    Vitals:  Blood pressure 180/60, pulse 58, temperature 98.1 F (36.7 C), temperature source Oral, resp. rate 17, height 5\' 1"  (1.549 m), weight 187 lb 11.2 oz (85.14 kg), SpO2 100 %.  Physical Exam: WD in NAD Neck  Supple NROM, without any enlargements.  Lymph Node Survey No cervical supraclavicular or inguinal adenopathy Cardiovascular  Pulse normal rate, regularity and rhythm. S1 and S2 normal.  Lungs  Clear to auscultation bilateraly, without wheezes/crackles/rhonchi. Good air movement.  Skin  No rash/lesions/breakdown  Psychiatry  Alert and oriented to person, place, and time  Abdomen  Normoactive bowel sounds, abdomen soft, non-tender and obese without evidence of hernia.  Back No CVA tenderness Genito Urinary  Vulva/vagina: 4-5cm anterior vulvar ulcerated lesion, very tender, covers clitoris, 1.5cm from urethral meatus. No nodularity.   Rectal  deferred Extremities  No bilateral cyanosis, clubbing or edema.   Donaciano Eva, MD  08/20/2015, 4:12 PM

## 2015-09-02 ENCOUNTER — Encounter (HOSPITAL_BASED_OUTPATIENT_CLINIC_OR_DEPARTMENT_OTHER): Payer: Self-pay | Admitting: *Deleted

## 2015-09-02 NOTE — Progress Notes (Signed)
NPO AFTER MN.  ARRIVE AT 0745.  NEEDS ISTAT.  CURRENT EKG IN CHART AND EPIC.  WILL TAKE AM MEDS  DOS W/ SIPS OF WATER.

## 2015-09-07 ENCOUNTER — Encounter (HOSPITAL_BASED_OUTPATIENT_CLINIC_OR_DEPARTMENT_OTHER): Payer: Self-pay | Admitting: Certified Registered"

## 2015-09-07 ENCOUNTER — Ambulatory Visit (INDEPENDENT_AMBULATORY_CARE_PROVIDER_SITE_OTHER): Payer: Medicare Other

## 2015-09-07 ENCOUNTER — Ambulatory Visit (INDEPENDENT_AMBULATORY_CARE_PROVIDER_SITE_OTHER): Payer: Medicare Other | Admitting: Family Medicine

## 2015-09-07 ENCOUNTER — Ambulatory Visit (HOSPITAL_BASED_OUTPATIENT_CLINIC_OR_DEPARTMENT_OTHER)
Admission: RE | Admit: 2015-09-07 | Discharge: 2015-09-07 | Disposition: A | Discharge status: Home / Self Care | Payer: Medicare Other | Source: Ambulatory Visit | Attending: Gynecologic Oncology | Admitting: Gynecologic Oncology

## 2015-09-07 ENCOUNTER — Encounter (HOSPITAL_BASED_OUTPATIENT_CLINIC_OR_DEPARTMENT_OTHER)
Admission: RE | Disposition: A | Discharge status: Home / Self Care | Payer: Self-pay | Source: Ambulatory Visit | Attending: Gynecologic Oncology

## 2015-09-07 VITALS — BP 148/68 | HR 78 | Temp 97.1°F | Resp 16 | Ht 62.0 in | Wt 186.0 lb

## 2015-09-07 DIAGNOSIS — R0600 Dyspnea, unspecified: Secondary | ICD-10-CM

## 2015-09-07 DIAGNOSIS — R0609 Other forms of dyspnea: Secondary | ICD-10-CM | POA: Diagnosis not present

## 2015-09-07 DIAGNOSIS — R062 Wheezing: Secondary | ICD-10-CM

## 2015-09-07 DIAGNOSIS — R05 Cough: Secondary | ICD-10-CM

## 2015-09-07 DIAGNOSIS — N909 Noninflammatory disorder of vulva and perineum, unspecified: Secondary | ICD-10-CM | POA: Insufficient documentation

## 2015-09-07 DIAGNOSIS — R059 Cough, unspecified: Secondary | ICD-10-CM

## 2015-09-07 DIAGNOSIS — Z5309 Procedure and treatment not carried out because of other contraindication: Secondary | ICD-10-CM | POA: Diagnosis not present

## 2015-09-07 HISTORY — DX: Essential (primary) hypertension: I10

## 2015-09-07 HISTORY — DX: Type 2 diabetes mellitus without complications: E11.9

## 2015-09-07 HISTORY — DX: Carcinoma in situ of vulva: D07.1

## 2015-09-07 HISTORY — DX: Hypothyroidism, unspecified: E03.9

## 2015-09-07 LAB — POCT CBC
Granulocyte percent: 65.6 %G (ref 37–80)
HCT, POC: 33.9 % — AB (ref 37.7–47.9)
Hemoglobin: 11.3 g/dL — AB (ref 12.2–16.2)
Lymph, poc: 1.7 (ref 0.6–3.4)
MCH, POC: 26.8 pg — AB (ref 27–31.2)
MCHC: 33.4 g/dL (ref 31.8–35.4)
MCV: 80.2 fL (ref 80–97)
MID (cbc): 0.6 (ref 0–0.9)
MPV: 6.7 fL (ref 0–99.8)
POC Granulocyte: 4.5 (ref 2–6.9)
POC LYMPH PERCENT: 25.4 %L (ref 10–50)
POC MID %: 9 %M (ref 0–12)
Platelet Count, POC: 205 10*3/uL (ref 142–424)
RBC: 4.22 M/uL (ref 4.04–5.48)
RDW, POC: 16.6 %
WBC: 6.8 10*3/uL (ref 4.6–10.2)

## 2015-09-07 SURGERY — WIDE EXCISION VULVECTOMY
Anesthesia: Choice

## 2015-09-07 MED ORDER — FENTANYL CITRATE (PF) 100 MCG/2ML IJ SOLN
INTRAMUSCULAR | Status: AC
  Filled 2015-09-07: qty 6

## 2015-09-07 MED ORDER — LACTATED RINGERS IV SOLN
INTRAVENOUS | Status: DC
  Filled 2015-09-07: qty 1000

## 2015-09-07 MED ORDER — CEFTRIAXONE SODIUM 1 G IJ SOLR
1.0000 g | Freq: Once | INTRAMUSCULAR | Status: AC
  Administered 2015-09-07: 1 g via INTRAMUSCULAR

## 2015-09-07 MED ORDER — LEVOFLOXACIN 500 MG PO TABS: 500.0000 mg | ORAL_TABLET | Freq: Every day | ORAL | Status: DC

## 2015-09-07 SURGICAL SUPPLY — 48 items
APPLICATOR COTTON TIP 6IN STRL (MISCELLANEOUS) IMPLANT
BLADE CLIPPER SURG (BLADE) ×3 IMPLANT
BLADE SURG 15 STRL LF DISP TIS (BLADE) ×2 IMPLANT
BLADE SURG 15 STRL SS (BLADE) ×2
BNDG GAUZE ELAST 4 BULKY (GAUZE/BANDAGES/DRESSINGS) ×3 IMPLANT
BRIEF STRETCH FOR OB PAD LRG (UNDERPADS AND DIAPERS) ×3 IMPLANT
CANISTER SUCTION 2500CC (MISCELLANEOUS) ×3 IMPLANT
CATH FOLEY 2WAY SLVR  5CC 14FR (CATHETERS)
CATH FOLEY 2WAY SLVR 5CC 14FR (CATHETERS) IMPLANT
CATH ROBINSON RED A/P 14FR (CATHETERS) IMPLANT
COVER BACK TABLE 60X90IN (DRAPES) ×3 IMPLANT
DRAPE LG THREE QUARTER DISP (DRAPES) ×3 IMPLANT
DRAPE UNDERBUTTOCKS STRL (DRAPE) ×3 IMPLANT
GAUZE SPONGE 4X4 12PLY STRL (GAUZE/BANDAGES/DRESSINGS) IMPLANT
GAUZE SPONGE 4X4 16PLY XRAY LF (GAUZE/BANDAGES/DRESSINGS) IMPLANT
GLOVE BIO SURGEON STRL SZ 6 (GLOVE) ×6 IMPLANT
KIT ROOM TURNOVER WOR (KITS) ×3 IMPLANT
LEGGING LITHOTOMY PAIR STRL (DRAPES) ×3 IMPLANT
MANIFOLD NEPTUNE II (INSTRUMENTS) IMPLANT
NDL HYPO 25X1 1.5 SAFETY (NEEDLE) ×1 IMPLANT
NEEDLE HYPO 22GX1.5 SAFETY (NEEDLE) IMPLANT
NEEDLE HYPO 25X1 1.5 SAFETY (NEEDLE) ×2 IMPLANT
NS IRRIG 500ML POUR BTL (IV SOLUTION) ×3 IMPLANT
PACK BASIN DAY SURGERY FS (CUSTOM PROCEDURE TRAY) ×3 IMPLANT
PAD OB MATERNITY 4.3X12.25 (PERSONAL CARE ITEMS) ×3 IMPLANT
PENCIL BUTTON HOLSTER BLD 10FT (ELECTRODE) ×3 IMPLANT
SCOPETTES 8  STERILE (MISCELLANEOUS)
SCOPETTES 8 STERILE (MISCELLANEOUS) IMPLANT
SUT VIC AB 0 SH 27 (SUTURE) ×3 IMPLANT
SUT VIC AB 2-0 CT2 27 (SUTURE) IMPLANT
SUT VIC AB 2-0 SH 27 (SUTURE)
SUT VIC AB 2-0 SH 27X BRD (SUTURE) IMPLANT
SUT VIC AB 3-0 PS2 18 (SUTURE)
SUT VIC AB 3-0 PS2 18XBRD (SUTURE) IMPLANT
SUT VIC AB 3-0 SH 27 (SUTURE) ×6
SUT VIC AB 3-0 SH 27X BRD (SUTURE) ×6 IMPLANT
SUT VICRYL 2 0 18  UND BR (SUTURE)
SUT VICRYL 2 0 18 UND BR (SUTURE) IMPLANT
SUT VICRYL 4-0 PS2 18IN ABS (SUTURE) ×9 IMPLANT
SYR BULB IRRIGATION 50ML (SYRINGE) ×3 IMPLANT
SYRINGE CONTROL L 12CC (SYRINGE) ×2 IMPLANT
SYRINGE CONTROL LL 12CC (SYRINGE) ×1 IMPLANT
TOWEL OR 17X24 6PK STRL BLUE (TOWEL DISPOSABLE) ×3 IMPLANT
TRAY DSU PREP LF (CUSTOM PROCEDURE TRAY) ×3 IMPLANT
TUBE CONNECTING 12X1/4 (SUCTIONS) ×3 IMPLANT
UNDERPAD 30X30 INCONTINENT (UNDERPADS AND DIAPERS) ×3 IMPLANT
WATER STERILE IRR 500ML POUR (IV SOLUTION) ×3 IMPLANT
YANKAUER SUCT BULB TIP NO VENT (SUCTIONS) ×3 IMPLANT

## 2015-09-07 NOTE — Progress Notes (Signed)
Patient ID: Marissa Ballard MRN: [id number], DOB: 12/05/1933, 81 y.o. Date of Encounter: 09/07/2015, 11:32 AM  Primary Physician: No PCP Per Patient  Chief Complaint:  Chief Complaint  Patient presents with  . chest congestion    x 10 days     HPI: 81 y.o. year old female presents with a 10 day history of nasal congestion, post nasal drip, sore throat, and cough. Mild sinus pressure. Afebrile. No chills. Nasal congestion thick and green/yellow. Cough is productive of green/yellow sputum and not associated with time of day. Ears feel full, leading to sensation of muffled hearing. Has tried OTC cold preps without success. No GI complaints.   No sick contacts, recent antibiotics, or recent travels.   No leg trauma, sedentary periods, h/o cancer, or tobacco use.  She was refused surgery today  Because anesthesiologist heard problems before surgery for precancerous lesion on vulva.  Patient volunteers at school  Past Medical History  Diagnosis Date  . Hypothyroidism   . Type 2 diabetes mellitus ([place])   . VIN III (vulvar intraepithelial neoplasia III)   . Hypertension      Home Meds: Prior to Admission medications   Medication Sig Start Date End Date Taking? Authorizing Provider  amLODipine (NORVASC) 10 MG tablet Take 1 tablet (10 mg total) by mouth daily. Patient taking differently: Take 10 mg by mouth every morning.  05/14/15  Yes Vivi Barrack, MD  levothyroxine (SYNTHROID, LEVOTHROID) 125 MCG tablet Take 1 tablet (125 mcg total) by mouth daily before breakfast. 02/04/15  Yes Robyn Haber, MD  metFORMIN (GLUCOPHAGE) 1000 MG tablet Take 1,000 mg by mouth every evening.    Yes Historical Provider, MD  Vitamins A & D (VITAMIN A & D) ointment Apply 1 application topically as needed (vulvar area).   Yes Historical Provider, MD    Allergies: No Known Allergies  Social History   Social History  . Marital Status: Married    Spouse Name: N/A  . Number of Children: N/A  .  Years of Education: N/A   Occupational History  . Not on file.   Social History Main Topics  . Smoking status: Never Smoker   . Smokeless tobacco: Never Used  . Alcohol Use: No  . Drug Use: No  . Sexual Activity: Not on file   Other Topics Concern  . 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      Home Situation: lives with her husband and he requires a lot care as he has dementia and CHF      Spiritual Beliefs:       Lifestyle: active at work, no regular cardiovascular activity, diet - avoid beef and pork, weakness is sweets              Review of Systems: Constitutional: negative for chills, fever, night sweats or weight changes Cardiovascular: negative for chest pain or palpitations Respiratory: negative for hemoptysis, Abdominal: negative for abdominal pain, nausea, vomiting or diarrhea Dermatological: negative for rash Neurologic: negative for headache   Physical Exam: Blood pressure 148/68, pulse 78, temperature 97.1 F (36.2 C), temperature source Oral, resp. rate 16, height 5\' 2"  (1.575 m), weight 186 lb (84.369 kg), SpO2 97 %., Body mass index is 34.01 kg/(m^2). General: Well developed, well nourished, in no acute distress. Head: Normocephalic, atraumatic, eyes without discharge, sclera non-icteric, nares are congested. Bilateral auditory canals clear, TM's are  without perforation, pearly grey with reflective cone of light bilaterally. No sinus TTP. Oral cavity moist, dentition normal. Posterior pharynx with post nasal drip and mild erythema. No peritonsillar abscess or tonsillar exudate. Neck: Supple. No thyromegaly. Full ROM. No lymphadenopathy. Lungs: Coarse breath sounds bilaterally without wheezes, rales, or rhonchi. Breathing is unlabored.  Heart: RRR with S1 S2. No murmurs, rubs, or gallops appreciated. Msk:  Strength and tone normal for age. Extremities: No clubbing or cyanosis. No  edema. Neuro: Alert and oriented X 3. Moves all extremities spontaneously. CNII-XII grossly in tact. Psych:  Responds to questions appropriately with a normal affect.   [facility] reading (PRIMARY) by  Dr. Joseph Art CXR:  Suggestion of right middle lobe infiltrate.  Results for orders placed or performed in visit on 09/07/15  POCT CBC  Result Value Ref Range   WBC 6.8 4.6 - 10.2 K/uL   Lymph, poc 1.7 0.6 - 3.4   POC LYMPH PERCENT 25.4 10 - 50 %L   MID (cbc) 0.6 0 - 0.9   POC MID % 9.0 0 - 12 %M   POC Granulocyte 4.5 2 - 6.9   Granulocyte percent 65.6 37 - 80 %G   RBC 4.22 4.04 - 5.48 M/uL   Hemoglobin 11.3 (A) 12.2 - 16.2 g/dL   HCT, POC 33.9 (A) 37.7 - 47.9 %   MCV 80.2 80 - 97 fL   MCH, POC 26.8 (A) 27 - 31.2 pg   MCHC 33.4 31.8 - 35.4 g/dL   RDW, POC 16.6 %   Platelet Count, POC 205 142 - 424 K/uL   MPV 6.7 0 - 99.8 fL      ASSESSMENT AND PLAN:  81 y.o. year old female with bronchitis, possibly early pneumonia -   ICD-9-CM ICD-10-CM   1. Cough 786.2 R05 DG Chest 2 View     POCT CBC     cefTRIAXone (ROCEPHIN) injection 1 g     levofloxacin (LEVAQUIN) 500 MG tablet  2. DOE (dyspnea on exertion) 786.09 R06.09 DG Chest 2 View     POCT CBC     cefTRIAXone (ROCEPHIN) injection 1 g     levofloxacin (LEVAQUIN) 500 MG tablet  3. Wheezing 786.07 R06.2 DG Chest 2 View     POCT CBC     cefTRIAXone (ROCEPHIN) injection 1 g     levofloxacin (LEVAQUIN) 500 MG tablet   -Tylenol/Motrin prn -Rest/fluids -RTC precautions -RTC 3-5 days if no improvement  Signed, Robyn Haber, MD 09/07/2015 11:32 AM

## 2015-09-07 NOTE — Anesthesia Preprocedure Evaluation (Deleted)
Anesthesia Evaluation  General Assessment Comment:Pt has had productive cough x10 days. Denies fever. On exam rough Rhonchi and wheezes present. Case cancelled. ? Pneumonia/bronchitis. Need PCP evaluation. Dr. Denman George informed.  Airway        Dental   Pulmonary           Cardiovascular hypertension,      Neuro/Psych    GI/Hepatic   Endo/Other  diabetes  Renal/GU      Musculoskeletal   Abdominal   Peds  Hematology   Anesthesia Other Findings   Reproductive/Obstetrics                            Anesthesia Physical Anesthesia Plan  ASA:   Anesthesia Plan:    Post-op Pain Management:    Induction:   Airway Management Planned:   Additional Equipment:   Intra-op Plan:   Post-operative Plan:   Informed Consent:   Plan Discussed with:   Anesthesia Plan Comments: (Pt has had productive cough x10 days. Denies fever. On exam rough Rhonchi and wheezes present. Case cancelled. ? Pneumonia/bronchitis. Need PCP evaluation. Dr. Denman George informed.)        Anesthesia Quick Evaluation

## 2015-09-07 NOTE — Patient Instructions (Signed)
Please return on Thursday morning for recheck.

## 2015-09-09 ENCOUNTER — Ambulatory Visit (INDEPENDENT_AMBULATORY_CARE_PROVIDER_SITE_OTHER): Payer: Medicare Other | Admitting: Family Medicine

## 2015-09-09 VITALS — BP 148/76 | HR 69 | Temp 98.2°F | Resp 16 | Ht 62.0 in | Wt 186.0 lb

## 2015-09-09 DIAGNOSIS — J189 Pneumonia, unspecified organism: Secondary | ICD-10-CM | POA: Diagnosis not present

## 2015-09-09 DIAGNOSIS — R21 Rash and other nonspecific skin eruption: Secondary | ICD-10-CM

## 2015-09-09 MED ORDER — HYDROCODONE-HOMATROPINE 5-1.5 MG/5ML PO SYRP
5.0000 mL | ORAL_SOLUTION | Freq: Three times a day (TID) | ORAL | Status: DC | PRN
Start: 2015-09-09 — End: 2015-10-05

## 2015-09-09 MED ORDER — PREDNISONE 20 MG PO TABS: ORAL_TABLET | ORAL | Status: DC

## 2015-09-09 NOTE — Progress Notes (Signed)
Subjective:  This chart was scribed for Robyn Haber MD, by Tamsen Roers, at [facility].  This patient was seen in room 1 and the patient's care was started at 10:42 AM.   Chief Complaint  Patient presents with  . Cough    x 3 weeks   . Shortness of Breath    x today     Patient ID: Marissa Ballard, female    DOB: 12/09/1933, 81 y.o.   MRN: [id number]  HPI HPI Comments: Marissa Ballard is a 81 y.o. female who presents to the [facility] complaining of a worsening dry cough which she states is especially worse today.  Patient states that she has associated symptoms of shortness of breath.  She was diagnosed with a right middle lobe pneumonia recently.  Patient has been compliant with her medication and feels that it is helping her significantly. After taking the medication, she feels very "warm on the inside" which only lasts for about an hour.  She starts to violently cough during this time and feels like "everything is breaking up".  She now also has a rash on her face (cheeks) bilaterally.  She has not checked her sugar levels recently. 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     Patient Active Problem List   Diagnosis Date Noted  . Leukocytosis   . Nausea vomiting and diarrhea   . Essential hypertension   . Lactic acidosis 05/13/2015  . AKI (acute kidney injury) ([place]) 05/13/2015  . Diarrhea 05/13/2015  . Nausea with vomiting 05/13/2015  . Atrophic vaginitis 03/03/2014  . DIABETES MELLITUS, TYPE II, UNCONTROLLED 10/06/2008  . HYPOTHYROIDISM 01/22/2008  . OBESITY 01/22/2008   Past Medical History  Diagnosis Date  . Hypothyroidism   . Type 2 diabetes mellitus ([place])   . VIN III (vulvar intraepithelial neoplasia III)   . Hypertension    Past Surgical History  Procedure Laterality Date  . Appendectomy  1952  . Abdominal hysterectomy  1972  . Shoulder arthroscopy Right 2000    impingement  . Cataract extraction  w/ intraocular lens implant Left    No Known Allergies Prior to Admission medications   Medication Sig Start Date End Date Taking? Authorizing Provider  amLODipine (NORVASC) 10 MG tablet Take 1 tablet (10 mg total) by mouth daily. Patient taking differently: Take 10 mg by mouth every morning.  05/14/15  Yes Vivi Barrack, MD  levofloxacin (LEVAQUIN) 500 MG tablet Take 1 tablet (500 mg total) by mouth daily. 09/07/15  Yes Robyn Haber, MD  levothyroxine (SYNTHROID, LEVOTHROID) 125 MCG tablet Take 1 tablet (125 mcg total) by mouth daily before breakfast. 02/04/15  Yes Robyn Haber, MD  metFORMIN (GLUCOPHAGE) 1000 MG tablet Take 1,000 mg by mouth every evening.    Yes Historical Provider, MD  Vitamins A & D (VITAMIN A & D) ointment Apply 1 application topically as needed (vulvar area).   Yes Historical Provider, MD   Social History   Social History  . Marital Status: Married    Spouse Name: N/A  . Number of Children: N/A  . Years of Education: N/A   Occupational History  . Not on file.   Social History Main Topics  . Smoking status: Never Smoker   . Smokeless tobacco: Never Used  . Alcohol Use: No  . Drug Use: No  . Sexual Activity: Not on file   Other Topics Concern  . 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  Eyes: Negative for pain, redness and itching.  Respiratory: Positive for cough, shortness of breath and wheezing.   Gastrointestinal: Negative for nausea and vomiting.  Musculoskeletal: Negative for neck pain and neck stiffness.  Skin: Positive for rash.         Objective:   Physical Exam  Constitutional: She is oriented to person, place, and time. She appears well-developed and well-nourished. No distress.  HENT:  Head: Normocephalic and atraumatic.  Eyes: Pupils are equal, round, and reactive to light.  Neck: Normal range of motion.  Pulmonary/Chest: No respiratory distress. She has wheezes. She has rales.  Bi basilar rales, bibasilar expiratory wheezes.   Musculoskeletal: Normal range of motion.  Neurological: She is alert and oriented to person, place, and time.  Skin: Skin is warm and dry.  Psychiatric: She has a normal mood and affect. Her behavior is normal.   patient has a facial erythematous scaly rash bilaterally  Poor dental condition Harsh cough Filed Vitals:   09/09/15 1027  BP: 148/76  Pulse: 69  Temp: 98.2 F (36.8 C)  TempSrc: Oral  Resp: 16  Height: 5\' 2"  (1.575 m)  Weight: 186 lb (84.369 kg)  SpO2: 96%        Assessment & Plan:   This chart was scribed in my presence and reviewed by me personally.    ICD-9-CM ICD-10-CM   1. CAP (community acquired pneumonia) 38 J18.9 HYDROcodone-homatropine (HYCODAN) 5-1.5 MG/5ML syrup  2. Rash and nonspecific skin eruption 782.1 R21 predniSONE (DELTASONE) 20 MG tablet   continue the Levaquin. Recheck in 6 days   Signed, Robyn Haber, MD

## 2015-09-09 NOTE — Patient Instructions (Signed)
Please return on Wednesday between 8 and 4 so we can recheck to make sure that the pneumonia is completely resolved.

## 2015-09-15 ENCOUNTER — Ambulatory Visit (INDEPENDENT_AMBULATORY_CARE_PROVIDER_SITE_OTHER): Payer: Medicare Other | Admitting: Family Medicine

## 2015-09-15 ENCOUNTER — Ambulatory Visit (INDEPENDENT_AMBULATORY_CARE_PROVIDER_SITE_OTHER): Payer: Medicare Other

## 2015-09-15 VITALS — BP 128/60 | HR 53 | Temp 98.4°F | Resp 16 | Ht 62.0 in | Wt 181.8 lb

## 2015-09-15 DIAGNOSIS — J189 Pneumonia, unspecified organism: Secondary | ICD-10-CM

## 2015-09-15 MED ORDER — METHYLPREDNISOLONE ACETATE 80 MG/ML IJ SUSP
80.0000 mg | Freq: Once | INTRAMUSCULAR | Status: AC
  Administered 2015-09-15: 80 mg via INTRAMUSCULAR

## 2015-09-15 NOTE — Progress Notes (Addendum)
This chart was scribed for Robyn Haber, MD by Moises Blood, medical scribe at [facility].The patient was seen in exam room 10 and the patient's care was started at 11:47 AM.  Patient ID: Marissa Ballard MRN: [id number], DOB: Dec 01, 1933, 81 y.o. Date of Encounter: 09/15/2015  Primary Physician: No PCP Per Patient  Chief Complaint:  Chief Complaint  Patient presents with   Follow-up    cough,CAP x1 week/ Dr. Carlean Jews    HPI:  Marissa Ballard is a 81 y.o. female who presents to [facility] for follow up on cough and community acquired pneumonia that started a week ago. She's coughing more today than yesterday and also felt better yesterday than today. She noticed having some shortness of breath today. She denies fever.   Past Medical History  Diagnosis Date   Hypothyroidism    Type 2 diabetes mellitus ([place])    VIN III (vulvar intraepithelial neoplasia III)    Hypertension      Home Meds: Prior to Admission medications   Medication Sig Start Date End Date Taking? Authorizing Provider  amLODipine (NORVASC) 10 MG tablet Take 1 tablet (10 mg total) by mouth daily. Patient taking differently: Take 10 mg by mouth every morning.  05/14/15   Vivi Barrack, MD  HYDROcodone-homatropine (HYCODAN) 5-1.5 MG/5ML syrup Take 5 mLs by mouth every 8 (eight) hours as needed for cough. 09/09/15   Robyn Haber, MD  levofloxacin (LEVAQUIN) 500 MG tablet Take 1 tablet (500 mg total) by mouth daily. 09/07/15   Robyn Haber, MD  levothyroxine (SYNTHROID, LEVOTHROID) 125 MCG tablet Take 1 tablet (125 mcg total) by mouth daily before breakfast. 02/04/15   Robyn Haber, MD  metFORMIN (GLUCOPHAGE) 1000 MG tablet Take 1,000 mg by mouth every evening.     Historical Provider, MD  predniSONE (DELTASONE) 20 MG tablet Two daily with food 09/09/15   Robyn Haber, MD  Vitamins A & D (VITAMIN A & D) ointment Apply 1 application topically as needed (vulvar area).     Historical Provider, MD    Allergies: No Known Allergies  Social History   Social History   Marital Status: Married    Spouse Name: N/A   Number of Children: N/A   Years of Education: N/A   Occupational History   Not on file.   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      Home Situation: lives with her husband and he requires a lot care as he has dementia and CHF      Spiritual Beliefs:       Lifestyle: active at work, no regular cardiovascular activity, diet - avoid beef and pork, weakness is sweets              Review of Systems: Constitutional: negative for chills, fever, night sweats, weight changes, or fatigue  HEENT: negative for vision changes, hearing loss, congestion, rhinorrhea, ST, epistaxis, or sinus pressure Cardiovascular: negative for chest pain or palpitations Respiratory: negative for hemoptysis, wheezing; positive for cough, shortness of breath Abdominal: negative for abdominal pain, nausea, vomiting, diarrhea, or constipation Dermatological: negative for rash Neurologic: negative for  headache, dizziness, or syncope All other systems reviewed and are otherwise negative with the exception to those above and in the HPI.  Physical Exam: Blood pressure 128/60, pulse 53, temperature 98.4 F (36.9 C), temperature source Oral, resp. rate 16, height 5\' 2"  (1.575 m), weight 181 lb 12.8 oz (82.464 kg), SpO2 98 %., Body mass index is 33.24 kg/(m^2). General: Well developed, well nourished, in no acute distress. Head: Normocephalic, atraumatic, eyes without discharge, sclera non-icteric, nares are without discharge. Bilateral auditory canals clear, TM's are without perforation, pearly grey and  translucent with reflective cone of light bilaterally. Oral cavity moist, posterior pharynx without exudate, erythema, peritonsillar abscess, or post nasal drip.  Neck: Supple. No thyromegaly. Full ROM. No lymphadenopathy. Lungs: Breathing is unlabored. Some rales in left base, some wheezing Heart: RRR with S1 S2. No murmurs, rubs, or gallops appreciated. Msk:  Strength and tone normal for age. Extremities/Skin: Warm and dry. No clubbing or cyanosis. No edema. No rashes or suspicious lesions. Neuro: Alert and oriented X 3. Moves all extremities spontaneously. Gait is normal. CNII-XII grossly in tact. Psych:  Responds to questions appropriately with a normal affect.   [facility] reading (PRIMARY) by  Dr. Joseph Art  CXR:  Middle lobe appears to be less dense and smaller  ASSESSMENT AND PLAN:  81 y.o. year old female with resolving pneumonia, continued cough and wheezing. This chart was scribed in my presence and reviewed by me personally.    ICD-9-CM ICD-10-CM   1. CAP (community acquired pneumonia) 486 J18.9 DG Chest 2 View     methylPREDNISolone acetate (DEPO-MEDROL) injection 80 mg     By signing my name below, I, Moises Blood, attest that this documentation has been prepared under the direction and in the presence of Robyn Haber, MD. Electronically Signed: Moises Blood, Scribe. 09/15/2015 , 11:47 AM .  Signed, Robyn Haber, MD 09/15/2015 11:47 AM

## 2015-09-15 NOTE — Patient Instructions (Signed)
I want you to call me if the cough is not getting better over the next 72 hours. I'm expecting that he'll be ready for surgery next week

## 2015-09-22 ENCOUNTER — Telehealth: Payer: Self-pay

## 2015-09-22 NOTE — Telephone Encounter (Signed)
Pt wants to let dr Joseph Art know that she believes that the shot has helped but she feels she needs more antibodics    Best number [phone]

## 2015-09-22 NOTE — Telephone Encounter (Signed)
Please advise 

## 2015-09-22 NOTE — Telephone Encounter (Signed)
Orders received to contact the patient to see if her PCP Dr Joseph Art had given her clearance for surgery D/T recent diagnosis of pneumonia . Patient states she had attempted to contacted Dr Joseph Art today as she has not been feeling well over the last few days . Patient states she will contact us when her PCP contacts her with his recommendations.

## 2015-09-23 ENCOUNTER — Telehealth: Payer: Self-pay | Admitting: *Deleted

## 2015-09-23 ENCOUNTER — Other Ambulatory Visit: Payer: Self-pay | Admitting: Family Medicine

## 2015-09-23 DIAGNOSIS — R062 Wheezing: Secondary | ICD-10-CM

## 2015-09-23 DIAGNOSIS — R05 Cough: Secondary | ICD-10-CM

## 2015-09-23 DIAGNOSIS — R0600 Dyspnea, unspecified: Secondary | ICD-10-CM

## 2015-09-23 DIAGNOSIS — J189 Pneumonia, unspecified organism: Secondary | ICD-10-CM

## 2015-09-23 DIAGNOSIS — R059 Cough, unspecified: Secondary | ICD-10-CM

## 2015-09-23 DIAGNOSIS — R0609 Other forms of dyspnea: Secondary | ICD-10-CM

## 2015-09-23 MED ORDER — LEVOFLOXACIN 500 MG PO TABS: 500.0000 mg | ORAL_TABLET | Freq: Every day | ORAL | Status: DC

## 2015-09-23 NOTE — Telephone Encounter (Signed)
Pharmacy called and stated that the rx for antibiotic levaquin is too expensive.  Can we switch her to something else?

## 2015-09-24 MED ORDER — AZITHROMYCIN 250 MG PO TABS: ORAL_TABLET | ORAL | Status: AC

## 2015-09-24 NOTE — Telephone Encounter (Signed)
Left VM that we sent in different abx.

## 2015-09-24 NOTE — Telephone Encounter (Signed)
I've sent in a zpak instead.

## 2015-10-05 ENCOUNTER — Ambulatory Visit (INDEPENDENT_AMBULATORY_CARE_PROVIDER_SITE_OTHER): Payer: Medicare Other | Admitting: Family Medicine

## 2015-10-05 VITALS — BP 172/96 | HR 62 | Temp 98.9°F | Resp 16 | Ht 62.0 in | Wt 182.8 lb

## 2015-10-05 DIAGNOSIS — R05 Cough: Secondary | ICD-10-CM

## 2015-10-05 DIAGNOSIS — E119 Type 2 diabetes mellitus without complications: Secondary | ICD-10-CM

## 2015-10-05 DIAGNOSIS — H65191 Other acute nonsuppurative otitis media, right ear: Secondary | ICD-10-CM | POA: Diagnosis not present

## 2015-10-05 DIAGNOSIS — M25561 Pain in right knee: Secondary | ICD-10-CM | POA: Diagnosis not present

## 2015-10-05 DIAGNOSIS — R300 Dysuria: Secondary | ICD-10-CM

## 2015-10-05 DIAGNOSIS — R059 Cough, unspecified: Secondary | ICD-10-CM

## 2015-10-05 LAB — POCT URINALYSIS DIP (MANUAL ENTRY)
Bilirubin, UA: NEGATIVE
Glucose, UA: NEGATIVE
Ketones, POC UA: NEGATIVE
Nitrite, UA: NEGATIVE
Protein Ur, POC: 100 — AB
Spec Grav, UA: 1.02
Urobilinogen, UA: 0.2
pH, UA: 5.5

## 2015-10-05 LAB — POC MICROSCOPIC URINALYSIS (UMFC): Mucus: ABSENT

## 2015-10-05 LAB — POCT CBC
Granulocyte percent: 73.2 %G (ref 37–80)
HCT, POC: 35.9 % — AB (ref 37.7–47.9)
Hemoglobin: 11.8 g/dL — AB (ref 12.2–16.2)
Lymph, poc: 1.7 (ref 0.6–3.4)
MCH, POC: 26.1 pg — AB (ref 27–31.2)
MCHC: 32.9 g/dL (ref 31.8–35.4)
MCV: 79.1 fL — AB (ref 80–97)
MID (cbc): 0.5 (ref 0–0.9)
MPV: 7 fL (ref 0–99.8)
POC Granulocyte: 6 (ref 2–6.9)
POC LYMPH PERCENT: 20.9 %L (ref 10–50)
POC MID %: 5.9 %M (ref 0–12)
Platelet Count, POC: 226 10*3/uL (ref 142–424)
RBC: 4.53 M/uL (ref 4.04–5.48)
RDW, POC: 16.1 %
WBC: 8.2 10*3/uL (ref 4.6–10.2)

## 2015-10-05 LAB — POCT GLYCOSYLATED HEMOGLOBIN (HGB A1C): Hemoglobin A1C: 7.4

## 2015-10-05 LAB — GLUCOSE, POCT (MANUAL RESULT ENTRY): POC Glucose: 212 mg/dl — AB (ref 70–99)

## 2015-10-05 LAB — HEMOGLOBIN A1C: Hgb A1c MFr Bld: 7.4 % — AB (ref 4.0–6.0)

## 2015-10-05 MED ORDER — HYDROCODONE-HOMATROPINE 5-1.5 MG/5ML PO SYRP
5.0000 mL | ORAL_SOLUTION | Freq: Three times a day (TID) | ORAL | Status: DC | PRN
Start: 2015-10-05 — End: 2015-10-05

## 2015-10-05 MED ORDER — HYDROCODONE-HOMATROPINE 5-1.5 MG/5ML PO SYRP: 5.0000 mL | ORAL_SOLUTION | Freq: Three times a day (TID) | ORAL | Status: DC | PRN

## 2015-10-05 MED ORDER — FLUCONAZOLE 150 MG PO TABS: 150.0000 mg | ORAL_TABLET | Freq: Once | ORAL | Status: DC

## 2015-10-05 MED ORDER — AMOXICILLIN 500 MG PO CAPS: 500.0000 mg | ORAL_CAPSULE | Freq: Three times a day (TID) | ORAL | Status: DC

## 2015-10-05 NOTE — Progress Notes (Addendum)
This chart was scribed for Marissa Haber, MD by Royann Shivers Rifaie medical scribe at [facility].The patient was seen in exam room 11 and the patient's care was started at 11:11 AM.  Patient ID: AMERY NAKAMOTO MRN: [id number], DOB: [birth year], 81 y.o. Date of Encounter: 10/05/2015  Primary Physician: No PCP Per Patient  Chief Complaint:  Chief Complaint  Patient presents with   Knee Pain    right   Ear Drainage    blood- last night    HPI:  Marissa Ballard is a 81 y.o. female with a recent Hx of PNA (one month ago), DM, and HTN who presents to [facility] complaining of bloody right ear drainage, onset yesterday.   Pt reports symptoms of rhinorrhea, cough that cause her urinary incontinence with dysuria. She denies hearing loss of the right ear. She states that she finished the course of her medications for PNA.   She also reports symptoms of right knee and calf pain. Pt notes that she had an episode where she felt severe stiffness and tenderness to the area that she was not able to walk. She took aleve for the pain and found some relief with it that allowed her to be able to walk. Pt denies swelling and soreness of the calf. Pt does not her blood glucose regularly. Pt does not have any allergies.  Pt was planning to have a surgery on her hip, however she was diagnosed with PNA, and therefore she plans to have it done by the end of this year. She will need medical clearance for it.   Past Medical History  Diagnosis Date   Hypothyroidism    Type 2 diabetes mellitus ([place])    VIN III (vulvar intraepithelial neoplasia III)    Hypertension      Home Meds: Prior to Admission medications   Medication Sig Start Date End Date Taking? Authorizing Provider  amLODipine (NORVASC) 10 MG tablet Take 1 tablet (10 mg total) by mouth daily. Patient taking differently: Take 10 mg by mouth every morning.  05/14/15   Vivi Barrack, MD    HYDROcodone-homatropine (HYCODAN) 5-1.5 MG/5ML syrup Take 5 mLs by mouth every 8 (eight) hours as needed for cough. 09/09/15   Marissa Haber, MD  levothyroxine (SYNTHROID, LEVOTHROID) 125 MCG tablet Take 1 tablet (125 mcg total) by mouth daily before breakfast. 02/04/15   Marissa Haber, MD  metFORMIN (GLUCOPHAGE) 1000 MG tablet Take 1,000 mg by mouth every evening.     Historical Provider, MD  predniSONE (DELTASONE) 20 MG tablet Two daily with food 09/09/15   Marissa Haber, MD  Vitamins A & D (VITAMIN A & D) ointment Apply 1 application topically as needed (vulvar area).    Historical Provider, MD    Allergies: No Known Allergies  Social History   Social History   Marital Status: Married    Spouse Name: N/A   Number of Children: N/A   Years of Education: N/A   Occupational History   Not on file.   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 Positive for rhinorrhea, and ear drainage.  Cardiovascular: negative for chest pain or palpitations Respiratory: negative for hemoptysis, wheezing, shortness of breath,. Positive for cough. Abdominal: negative for abdominal pain, nausea, vomiting, diarrhea, or constipation Dermatological: negative for rash Neurologic: negative  for headache, dizziness, or syncope. Genitourinary: Positive for urinary incontinence, dysuria.   Msk: positive for arthralgia, myalgia.  All other systems reviewed and are otherwise negative with the exception to those above and in the HPI.  Physical Exam: Blood pressure 172/96, pulse 62, temperature 98.9 F (37.2 C), temperature source Oral, resp. rate 16, height 5\' 2"  (1.575 m), weight 182 lb 12.8 oz (82.918 kg), SpO2 96 %., Body mass index is 33.43 kg/(m^2). General: Well developed, well nourished, in no acute distress. Head: Normocephalic, atraumatic, eyes without discharge, sclera non-icteric, nares are without discharge. Bilateral auditory canals clear, TM's are without perforation, pearly grey and translucent with reflective cone of light bilaterally. Oral cavity moist, posterior pharynx without exudate, erythema, peritonsillar abscess, or post nasal drip.  Neck: Supple. No thyromegaly. Full ROM. No lymphadenopathy. Lungs: Clear bilaterally to auscultation without wheezes, rales, or rhonchi. Breathing is unlabored. Heart: RRR with S1 S2. No murmurs, rubs, or gallops appreciated. Msk:  Strength and tone normal for age. Extremities/Skin: Warm and dry. No clubbing or cyanosis. No edema. No rashes or suspicious lesions. Neuro: Alert and oriented X 3. Moves all extremities spontaneously. Gait is normal. CNII-XII grossly in tact. Psych:  Responds to questions appropriately with a normal affect.   Blood pressure: left arm/resting- 170/80.  After sterile prep, the right knee was injected with 1 mL of Marcaine (0.5%) (and 1 mL of Depo-Medrol per cc 80 mg per mL) (no complications forthcoming) Labs: Results for orders placed or performed in visit on 10/05/15  POCT CBC  Result Value Ref Range   WBC 8.2 4.6 - 10.2 K/uL   Lymph, poc 1.7 0.6 - 3.4   POC LYMPH PERCENT 20.9 10 - 50 %L   MID (cbc) 0.5 0 - 0.9   POC MID % 5.9 0 - 12 %M   POC Granulocyte 6.0 2 - 6.9   Granulocyte percent 73.2 37  - 80 %G   RBC 4.53 4.04 - 5.48 M/uL   Hemoglobin 11.8 (A) 12.2 - 16.2 g/dL   HCT, POC 35.9 (A) 37.7 - 47.9 %   MCV 79.1 (A) 80 - 97 fL   MCH, POC 26.1 (A) 27 - 31.2 pg   MCHC 32.9 31.8 - 35.4 g/dL   RDW, POC 16.1 %   Platelet Count, POC 226 142 - 424 K/uL   MPV 7.0 0 - 99.8 fL  POCT glucose (manual entry)  Result Value Ref Range   POC Glucose 212 (A) 70 - 99 mg/dl  POCT glycosylated hemoglobin (Hb A1C)  Result Value Ref Range   Hemoglobin A1C 7.4   POCT urinalysis dipstick  Result Value Ref Range   Color, UA yellow yellow   Clarity, UA hazy (A) clear   Glucose, UA negative negative  Bilirubin, UA negative negative   Ketones, POC UA negative negative   Spec Grav, UA 1.020    Blood, UA trace-intact (A) negative   pH, UA 5.5    Protein Ur, POC =100 (A) negative   Urobilinogen, UA 0.2    Nitrite, UA Negative Negative   Leukocytes, UA small (1+) (A) Negative  POCT Microscopic Urinalysis ([facility])  Result Value Ref Range   WBC,UR,HPF,POC Many (A) None WBC/hpf   RBC,UR,HPF,POC Few (A) None RBC/hpf   Bacteria None None, Too numerous to count   Mucus Absent Absent   Epithelial Cells, UR Per Microscopy Few (A) None, Too numerous to count cells/hpf     ASSESSMENT AND PLAN:  81 y.o. year old female with   By signing my name below, I, Rawaa Al Rifaie, attest that this documentation has been prepared under the direction and in the presence of Marissa Haber, MD.  Royann Shivers Rifaie, Medical Scribe. 10/05/2015.  11:22 AM. This chart was scribed in my presence and reviewed by me personally.    ICD-9-CM ICD-10-CM   1. Dysuria 788.1 R30.0 POCT urinalysis dipstick     POCT Microscopic Urinalysis ([facility])     amoxicillin (AMOXIL) 500 MG capsule     fluconazole (DIFLUCAN) 150 MG tablet  2. Right knee pain 719.46 M25.561   3. Cough 786.2 R05 POCT CBC     HYDROcodone-homatropine (HYCODAN) 5-1.5 MG/5ML syrup     DISCONTINUED: HYDROcodone-homatropine (HYCODAN) 5-1.5 MG/5ML syrup  4. Acute  nonsuppurative otitis media of right ear 381.00 H65.191 POCT CBC     amoxicillin (AMOXIL) 500 MG capsule  5. Controlled type 2 diabetes mellitus without complication, without long-term current use of insulin (HCC) 250.00 E11.9 POCT glucose (manual entry)     POCT glycosylated hemoglobin (Hb A1C)     Signed, Marissa Haber, MD  Signed, Marissa Haber, MD 10/05/2015 12:10 PM

## 2015-10-13 ENCOUNTER — Encounter: Payer: Self-pay | Admitting: Family Medicine

## 2015-10-21 ENCOUNTER — Telehealth: Payer: Self-pay | Admitting: Family Medicine

## 2015-10-21 NOTE — Telephone Encounter (Signed)
-----   Message from Christa See, RN sent at 10/20/2015  3:12 PM EST ----- Regarding: Surgical clearance  Hi Dr. Joseph Art,  Dr. Denman George (gyn surgical oncologist) is planning to perform a wide local excision of the vulva on patient on 11/09/15 at [facility].   Dr. Denman George has requested medical clearance to proceed with surgery. Her previous surgery had been canceled by anesthesia due to pneumonia.   Thank you in advance,  Cyril Mourning, [place]

## 2015-10-21 NOTE — Telephone Encounter (Signed)
Left message for pt to call back  °

## 2015-10-21 NOTE — Telephone Encounter (Signed)
Please call patient for final pre-surgical clearance.  Ask her to come in so I can examine her.

## 2015-10-24 NOTE — Telephone Encounter (Signed)
I called patient to advise her she must be seen in office for surgical clearance. Left message

## 2015-10-26 ENCOUNTER — Ambulatory Visit (INDEPENDENT_AMBULATORY_CARE_PROVIDER_SITE_OTHER): Payer: Medicare Other | Admitting: Family Medicine

## 2015-10-26 VITALS — BP 146/84 | HR 56 | Temp 97.6°F | Resp 18 | Ht 62.0 in | Wt 177.0 lb

## 2015-10-26 DIAGNOSIS — R739 Hyperglycemia, unspecified: Secondary | ICD-10-CM

## 2015-10-26 DIAGNOSIS — N390 Urinary tract infection, site not specified: Secondary | ICD-10-CM

## 2015-10-26 DIAGNOSIS — H65192 Other acute nonsuppurative otitis media, left ear: Secondary | ICD-10-CM | POA: Diagnosis not present

## 2015-10-26 LAB — POCT URINALYSIS DIP (MANUAL ENTRY)
Bilirubin, UA: NEGATIVE
Glucose, UA: NEGATIVE
Ketones, POC UA: NEGATIVE
Nitrite, UA: NEGATIVE
Protein Ur, POC: 100 — AB
Spec Grav, UA: 1.02
Urobilinogen, UA: 0.2
pH, UA: 6

## 2015-10-26 LAB — POCT CBC
Granulocyte percent: 59.3 %G (ref 37–80)
HCT, POC: 37.7 % (ref 37.7–47.9)
Hemoglobin: 12.8 g/dL (ref 12.2–16.2)
Lymph, poc: 2.6 (ref 0.6–3.4)
MCH, POC: 26.5 pg — AB (ref 27–31.2)
MCHC: 33.8 g/dL (ref 31.8–35.4)
MCV: 78.5 fL — AB (ref 80–97)
MID (cbc): 0.6 (ref 0–0.9)
MPV: 6.5 fL (ref 0–99.8)
POC Granulocyte: 4.7 (ref 2–6.9)
POC LYMPH PERCENT: 32.5 %L (ref 10–50)
POC MID %: 8.2 %M (ref 0–12)
Platelet Count, POC: 224 10*3/uL (ref 142–424)
RBC: 4.81 M/uL (ref 4.04–5.48)
RDW, POC: 17.5 %
WBC: 7.9 10*3/uL (ref 4.6–10.2)

## 2015-10-26 LAB — POC MICROSCOPIC URINALYSIS (UMFC): Mucus: ABSENT

## 2015-10-26 LAB — POCT GLYCOSYLATED HEMOGLOBIN (HGB A1C): Hemoglobin A1C: 7.9

## 2015-10-26 LAB — GLUCOSE, POCT (MANUAL RESULT ENTRY): POC Glucose: 156 mg/dl — AB (ref 70–99)

## 2015-10-26 MED ORDER — AMOXICILLIN 500 MG PO CAPS: 500.0000 mg | ORAL_CAPSULE | Freq: Three times a day (TID) | ORAL | Status: AC

## 2015-10-26 NOTE — Progress Notes (Signed)
Patient Name: Marissa Ballard Date of Birth: February 18, 1934 Medical Record Number: [id number] Gender: female Date of Encounter: 10/26/2015  Chief Complaint: clearance and some hearing problem in right ear   History of Present Illness:  Marissa Ballard is a 81 y.o. very pleasant female patient who presents with the following   patient needs clearance for surgery. She's recently had pneumonia and elevated blood sugar. She still has trouble hearing out of her right ear. The cough is gone. His no polyuria , polydipsia, or change in vision.   Patient has no shortness of breath, chest pain, leg edema.  Patient Active Problem List   Diagnosis Date Noted  . DIABETES MELLITUS, TYPE II, UNCONTROLLED 10/06/2008    Priority: High  . HYPOTHYROIDISM 01/22/2008    Priority: High  . OBESITY 01/22/2008    Priority: High  . Atrophic vaginitis 03/03/2014    Priority: Medium  . Essential hypertension   . AKI (acute kidney injury) ([place]) 05/13/2015  . Diarrhea 05/13/2015   Past Medical History  Diagnosis Date  . Hypothyroidism   . Type 2 diabetes mellitus ([place])   . VIN III (vulvar intraepithelial neoplasia III)   . Hypertension    Past Surgical History  Procedure Laterality Date  . Appendectomy  1952  . Abdominal hysterectomy  1972  . Shoulder arthroscopy Right 2000    impingement  . Cataract extraction w/ intraocular lens implant Left    Social History  Substance Use Topics  . Smoking status: Never Smoker   . Smokeless tobacco: Never Used  . Alcohol Use: No   Family History  Problem Relation Age of Onset  . Breast cancer Mother   . Heart disease Mother   . Heart attack Father 65  . Cancer Sister     ? cervical ca  . Cancer Sister    No Known Allergies  Medication list has been reviewed and updated.  Current Outpatient Prescriptions on File Prior to Visit  Medication Sig Dispense Refill  . amLODipine (NORVASC) 10 MG tablet Take 1 tablet (10 mg total) by mouth daily.  (Patient taking differently: Take 10 mg by mouth every morning. ) 30 tablet 0  . levothyroxine (SYNTHROID, LEVOTHROID) 125 MCG tablet Take 1 tablet (125 mcg total) by mouth daily before breakfast. 90 tablet 3  . metFORMIN (GLUCOPHAGE) 1000 MG tablet Take 1,000 mg by mouth every evening.     . Vitamins A & D (VITAMIN A & D) ointment Apply 1 application topically as needed (vulvar area).    Marland Kitchen HYDROcodone-homatropine (HYCODAN) 5-1.5 MG/5ML syrup Take 5 mLs by mouth every 8 (eight) hours as needed for cough. (Patient not taking: Reported on 10/26/2015) 120 mL 0   No current facility-administered medications on file prior to visit.    Review of Systems:   negative for weight change, loss of appetite,headaches ,  difficulty a swallowing , polyuria , dysuria , shortness of breath, chest pain, nausea, vomiting, diarrhea, new skin rash , extremity pain Physical Examination: Filed Vitals:   10/26/15 1127  BP: 146/84  Pulse: 56  Temp: 97.6 F (36.4 C)  Resp: 18   Filed Vitals:   10/26/15 1127  Height: 5\' 2"  (1.575 m)  Weight: 177 lb (80.287 kg)   Body mass index is 32.37 kg/(m^2). Ideal Body Weight: Weight in (lb) to have BMI = 25: 136.4   alert and cooperative with noted acute distress HEENT: Negative except for decreased hearing in the right ear with marked retraction and  dried clotted blood on the right TM.   neck: Supple no adenopathy or bruits Chest: Clear to auscultation  Heart: Regular with a 1/6 systolic murmur at the right sternal border Abdomen: Soft nontender Extremity: No edema  EKG / Labs / Xrays: Results for orders placed or performed in visit on 10/13/15  Hemoglobin A1c  Result Value Ref Range   Hgb A1c MFr Bld 7.4 (A) 4.0 - 6.0 %   Results for orders placed or performed in visit on 10/26/15  POCT CBC  Result Value Ref Range   WBC 7.9 4.6 - 10.2 K/uL   Lymph, poc 2.6 0.6 - 3.4   POC LYMPH PERCENT 32.5 10 - 50 %L   MID (cbc) 0.6 0 - 0.9   POC MID % 8.2 0 - 12 %M     POC Granulocyte 4.7 2 - 6.9   Granulocyte percent 59.3 37 - 80 %G   RBC 4.81 4.04 - 5.48 M/uL   Hemoglobin 12.8 12.2 - 16.2 g/dL   HCT, POC 37.7 37.7 - 47.9 %   MCV 78.5 (A) 80 - 97 fL   MCH, POC 26.5 (A) 27 - 31.2 pg   MCHC 33.8 31.8 - 35.4 g/dL   RDW, POC 17.5 %   Platelet Count, POC 224 142 - 424 K/uL   MPV 6.5 0 - 99.8 fL  POCT glycosylated hemoglobin (Hb A1C)  Result Value Ref Range   Hemoglobin A1C 7.9   POCT glucose (manual entry)  Result Value Ref Range   POC Glucose 156 (A) 70 - 99 mg/dl     Assessment and Plan:  the anemia, elevated blood sugar , pneumonia have  Result. She still has signs of a residual right ear infection which is equal resolve over the next 7-10 days. Therefore think she safe to return to  Her scheduled surgery with Dr. Denman George  This chart was scribed in my presence and reviewed by me personally.    ICD-9-CM ICD-10-CM   1. Hyperglycemia 790.29 R73.9 POCT glycosylated hemoglobin (Hb A1C)     POCT glucose (manual entry)  2. Subacute nonsuppurative otitis media of left ear 381.00 H65.192 POCT CBC     amoxicillin (AMOXIL) 500 MG capsule  3. Urinary tract infection, site not specified 599.0 N39.0 POCT urinalysis dipstick     POCT Microscopic Urinalysis ([facility])     Urine culture    urine culture is pending  I have given her more amoxicillin to help finish clearing up ear infection animated dyes her to try blowing her nose gently to reexpand the Basilia Jumbo, MD

## 2015-10-28 LAB — URINE CULTURE
Colony Count: NO GROWTH
Organism ID, Bacteria: NO GROWTH

## 2015-11-04 ENCOUNTER — Encounter (HOSPITAL_BASED_OUTPATIENT_CLINIC_OR_DEPARTMENT_OTHER): Payer: Self-pay | Admitting: *Deleted

## 2015-11-04 NOTE — Progress Notes (Signed)
NPO AFTER MN.  ARRIVE AT 0730.  NEEDS ISTAT.  CURRENT CXR AND EKG IN CHART AND EPIC.  WILL TAKE AM MEDS W/ SIPS OF WATER.

## 2015-11-09 ENCOUNTER — Ambulatory Visit (HOSPITAL_BASED_OUTPATIENT_CLINIC_OR_DEPARTMENT_OTHER)
Admission: RE | Admit: 2015-11-09 | Discharge: 2015-11-09 | Disposition: A | Discharge status: Home / Self Care | Payer: Medicare Other | Source: Ambulatory Visit | Attending: Gynecologic Oncology | Admitting: Gynecologic Oncology

## 2015-11-09 ENCOUNTER — Encounter (HOSPITAL_BASED_OUTPATIENT_CLINIC_OR_DEPARTMENT_OTHER)
Admission: RE | Disposition: A | Discharge status: Home / Self Care | Payer: Self-pay | Source: Ambulatory Visit | Attending: Gynecologic Oncology

## 2015-11-09 ENCOUNTER — Encounter (HOSPITAL_BASED_OUTPATIENT_CLINIC_OR_DEPARTMENT_OTHER): Payer: Self-pay | Admitting: *Deleted

## 2015-11-09 ENCOUNTER — Ambulatory Visit (HOSPITAL_BASED_OUTPATIENT_CLINIC_OR_DEPARTMENT_OTHER): Payer: Medicare Other | Admitting: Anesthesiology

## 2015-11-09 DIAGNOSIS — E039 Hypothyroidism, unspecified: Secondary | ICD-10-CM | POA: Insufficient documentation

## 2015-11-09 DIAGNOSIS — Z79899 Other long term (current) drug therapy: Secondary | ICD-10-CM | POA: Insufficient documentation

## 2015-11-09 DIAGNOSIS — C519 Malignant neoplasm of vulva, unspecified: Secondary | ICD-10-CM | POA: Diagnosis not present

## 2015-11-09 DIAGNOSIS — Z7984 Long term (current) use of oral hypoglycemic drugs: Secondary | ICD-10-CM | POA: Diagnosis not present

## 2015-11-09 DIAGNOSIS — E119 Type 2 diabetes mellitus without complications: Secondary | ICD-10-CM | POA: Insufficient documentation

## 2015-11-09 DIAGNOSIS — I441 Atrioventricular block, second degree: Secondary | ICD-10-CM | POA: Insufficient documentation

## 2015-11-09 DIAGNOSIS — R001 Bradycardia, unspecified: Secondary | ICD-10-CM | POA: Insufficient documentation

## 2015-11-09 DIAGNOSIS — N904 Leukoplakia of vulva: Secondary | ICD-10-CM | POA: Diagnosis not present

## 2015-11-09 DIAGNOSIS — D071 Carcinoma in situ of vulva: Secondary | ICD-10-CM

## 2015-11-09 HISTORY — DX: Personal history of pneumonia (recurrent): Z87.01

## 2015-11-09 HISTORY — DX: Otitis media, unspecified, left ear: H66.92

## 2015-11-09 HISTORY — PX: VULVECTOMY: SHX1086

## 2015-11-09 LAB — POCT I-STAT, CHEM 8
BUN: 24 mg/dL — AB (ref 6–20)
CALCIUM ION: 1.24 mmol/L (ref 1.13–1.30)
CHLORIDE: 108 mmol/L (ref 101–111)
CREATININE: 0.9 mg/dL (ref 0.44–1.00)
GLUCOSE: 170 mg/dL — AB (ref 65–99)
HCT: 36 % (ref 36.0–46.0)
Hemoglobin: 12.2 g/dL (ref 12.0–15.0)
Potassium: 4.1 mmol/L (ref 3.5–5.1)
SODIUM: 143 mmol/L (ref 135–145)
TCO2: 24 mmol/L (ref 0–100)

## 2015-11-09 LAB — GLUCOSE, CAPILLARY: Glucose-Capillary: 145 mg/dL — ABNORMAL HIGH (ref 65–99)

## 2015-11-09 SURGERY — WIDE EXCISION VULVECTOMY
Anesthesia: Monitor Anesthesia Care

## 2015-11-09 MED ORDER — PROPOFOL 500 MG/50ML IV EMUL
INTRAVENOUS | Status: DC | PRN
  Administered 2015-11-09: 200 ug/kg/min via INTRAVENOUS

## 2015-11-09 MED ORDER — FENTANYL CITRATE (PF) 100 MCG/2ML IJ SOLN
25.0000 ug | INTRAMUSCULAR | Status: DC | PRN
  Filled 2015-11-09: qty 1

## 2015-11-09 MED ORDER — FENTANYL CITRATE (PF) 100 MCG/2ML IJ SOLN
INTRAMUSCULAR | Status: DC | PRN
  Administered 2015-11-09: 50 ug via INTRAVENOUS

## 2015-11-09 MED ORDER — HYDRALAZINE HCL 20 MG/ML IJ SOLN
INTRAMUSCULAR | Status: DC | PRN
  Administered 2015-11-09: 2.5 mg via INTRAVENOUS

## 2015-11-09 MED ORDER — ACETIC ACID 5 % SOLN
Status: DC | PRN
  Administered 2015-11-09: 1 via TOPICAL

## 2015-11-09 MED ORDER — DOCUSATE SODIUM 100 MG PO CAPS: 100.0000 mg | ORAL_CAPSULE | Freq: Two times a day (BID) | ORAL | Status: AC

## 2015-11-09 MED ORDER — MIDAZOLAM HCL 5 MG/5ML IJ SOLN
INTRAMUSCULAR | Status: DC | PRN
  Administered 2015-11-09: 0.5 mg via INTRAVENOUS

## 2015-11-09 MED ORDER — LIDOCAINE HCL (CARDIAC) 20 MG/ML IV SOLN
INTRAVENOUS | Status: DC | PRN
  Administered 2015-11-09: 60 mg via INTRAVENOUS

## 2015-11-09 MED ORDER — DEXAMETHASONE SODIUM PHOSPHATE 4 MG/ML IJ SOLN
INTRAMUSCULAR | Status: DC | PRN
  Administered 2015-11-09: 10 mg via INTRAVENOUS

## 2015-11-09 MED ORDER — LIDOCAINE HCL 1 % IJ SOLN
INTRAMUSCULAR | Status: DC | PRN
  Administered 2015-11-09: 20 mL

## 2015-11-09 MED ORDER — LACTATED RINGERS IV SOLN
INTRAVENOUS | Status: DC
  Administered 2015-11-09 (×2): via INTRAVENOUS
  Filled 2015-11-09: qty 1000

## 2015-11-09 MED ORDER — ONDANSETRON HCL 4 MG/2ML IJ SOLN
INTRAMUSCULAR | Status: DC | PRN
Start: 2015-11-09 — End: 2015-11-09
  Administered 2015-11-09: 4 mg via INTRAVENOUS

## 2015-11-09 MED ORDER — OXYCODONE-ACETAMINOPHEN 5-325 MG PO TABS: 1.0000 | ORAL_TABLET | ORAL | Status: AC | PRN

## 2015-11-09 SURGICAL SUPPLY — 53 items
APPLICATOR COTTON TIP 6IN STRL (MISCELLANEOUS) IMPLANT
BLADE CLIPPER SURG (BLADE) ×2 IMPLANT
BLADE SURG 15 STRL LF DISP TIS (BLADE) ×1 IMPLANT
BLADE SURG 15 STRL SS (BLADE) ×2
BNDG GAUZE ELAST 4 BULKY (GAUZE/BANDAGES/DRESSINGS) ×2 IMPLANT
BRIEF STRETCH FOR OB PAD LRG (UNDERPADS AND DIAPERS) ×2 IMPLANT
CANISTER SUCTION 2500CC (MISCELLANEOUS) ×2 IMPLANT
CATH FOLEY 2WAY SLVR  5CC 14FR (CATHETERS)
CATH FOLEY 2WAY SLVR 5CC 14FR (CATHETERS) IMPLANT
CATH ROBINSON RED A/P 14FR (CATHETERS) IMPLANT
CATH ROBINSON RED A/P 16FR (CATHETERS) ×1 IMPLANT
COVER BACK TABLE 60X90IN (DRAPES) ×3 IMPLANT
DRAPE LG THREE QUARTER DISP (DRAPES) ×2 IMPLANT
DRAPE UNDERBUTTOCKS STRL (DRAPE) ×2 IMPLANT
GAUZE SPONGE 4X4 12PLY STRL (GAUZE/BANDAGES/DRESSINGS) IMPLANT
GAUZE SPONGE 4X4 16PLY XRAY LF (GAUZE/BANDAGES/DRESSINGS) ×1 IMPLANT
GLOVE BIO SURGEON STRL SZ 6 (GLOVE) ×4 IMPLANT
GLOVE BIO SURGEON STRL SZ7 (GLOVE) ×1 IMPLANT
GLOVE BIOGEL PI IND STRL 7.0 (GLOVE) IMPLANT
GLOVE BIOGEL PI INDICATOR 7.0 (GLOVE) ×1
KIT ROOM TURNOVER WOR (KITS) ×2 IMPLANT
LEGGING LITHOTOMY PAIR STRL (DRAPES) ×2 IMPLANT
MANIFOLD NEPTUNE II (INSTRUMENTS) IMPLANT
NDL HYPO 25X1 1.5 SAFETY (NEEDLE) ×1 IMPLANT
NEEDLE HYPO 22GX1.5 SAFETY (NEEDLE) IMPLANT
NEEDLE HYPO 25X1 1.5 SAFETY (NEEDLE) ×2 IMPLANT
NS IRRIG 1000ML POUR BTL (IV SOLUTION) ×1 IMPLANT
NS IRRIG 500ML POUR BTL (IV SOLUTION) ×2 IMPLANT
PACK BASIN DAY SURGERY FS (CUSTOM PROCEDURE TRAY) ×2 IMPLANT
PAD OB MATERNITY 4.3X12.25 (PERSONAL CARE ITEMS) ×2 IMPLANT
PENCIL BUTTON HOLSTER BLD 10FT (ELECTRODE) ×2 IMPLANT
SCOPETTES 8  STERILE (MISCELLANEOUS)
SCOPETTES 8 STERILE (MISCELLANEOUS) IMPLANT
SUT VIC AB 0 SH 27 (SUTURE) ×2 IMPLANT
SUT VIC AB 2-0 CT2 27 (SUTURE) IMPLANT
SUT VIC AB 2-0 SH 27 (SUTURE)
SUT VIC AB 2-0 SH 27X BRD (SUTURE) IMPLANT
SUT VIC AB 3-0 PS2 18 (SUTURE)
SUT VIC AB 3-0 PS2 18XBRD (SUTURE) IMPLANT
SUT VIC AB 3-0 SH 27 (SUTURE) ×10
SUT VIC AB 3-0 SH 27X BRD (SUTURE) ×3 IMPLANT
SUT VICRYL 2 0 18  UND BR (SUTURE)
SUT VICRYL 2 0 18 UND BR (SUTURE) IMPLANT
SUT VICRYL 4-0 PS2 18IN ABS (SUTURE) ×8 IMPLANT
SYR BULB IRRIGATION 50ML (SYRINGE) ×2 IMPLANT
SYRINGE CONTROL L 12CC (SYRINGE) ×2 IMPLANT
SYRINGE CONTROL LL 12CC (SYRINGE) ×1 IMPLANT
TOWEL OR 17X24 6PK STRL BLUE (TOWEL DISPOSABLE) ×2 IMPLANT
TRAY DSU PREP LF (CUSTOM PROCEDURE TRAY) ×2 IMPLANT
TUBE CONNECTING 12X1/4 (SUCTIONS) ×2 IMPLANT
UNDERPAD 30X30 INCONTINENT (UNDERPADS AND DIAPERS) ×2 IMPLANT
WATER STERILE IRR 500ML POUR (IV SOLUTION) ×2 IMPLANT
YANKAUER SUCT BULB TIP NO VENT (SUCTIONS) ×2 IMPLANT

## 2015-11-09 NOTE — Progress Notes (Signed)
Patient has second degree heart block in PACU with bradycardia in low 40s ( this is normal for her ).  I advised her to go to a cardiologist about this problem.  No immediate danger.   Arleen Bar MD

## 2015-11-09 NOTE — Anesthesia Preprocedure Evaluation (Addendum)
Anesthesia Evaluation  Patient identified by MRN, date of birth, ID band Patient awake    Reviewed: Allergy & Precautions, H&P , NPO status , Patient's Chart, lab work & pertinent test results  Airway Mallampati: II  TM Distance: >3 FB Neck ROM: full    Dental  (+) Dental Advisory Given, Poor Dentition, Missing Missing all right upper front teeth and left are decayed.:   Pulmonary neg pulmonary ROS, pneumonia, resolved,    Pulmonary exam normal breath sounds clear to auscultation       Cardiovascular Exercise Tolerance: Good hypertension, Pt. on medications Normal cardiovascular exam Rhythm:regular Rate:Normal  13-May-2015 EKG Sinus rhythm Nonspecific T abnormalities, lateral leads   Neuro/Psych negative neurological ROS  negative psych ROS   GI/Hepatic negative GI ROS, Neg liver ROS,   Endo/Other  diabetes, Well Controlled, Type 2, Oral Hypoglycemic AgentsHypothyroidism   Renal/GU negative Renal ROS  negative genitourinary   Musculoskeletal   Abdominal   Peds  Hematology negative hematology ROS (+)   Anesthesia Other Findings   Reproductive/Obstetrics negative OB ROS                         Anesthesia Physical Anesthesia Plan  ASA: III  Anesthesia Plan: MAC   Post-op Pain Management:    Induction: Intravenous  Airway Management Planned: Nasal Cannula  Additional Equipment:   Intra-op Plan:   Post-operative Plan:   Informed Consent: I have reviewed the patients History and Physical, chart, labs and discussed the procedure including the risks, benefits and alternatives for the proposed anesthesia with the patient or authorized representative who has indicated his/her understanding and acceptance.   Dental Advisory Given  Plan Discussed with: CRNA, Surgeon and Anesthesiologist  Anesthesia Plan Comments:        Anesthesia Quick Evaluation

## 2015-11-09 NOTE — H&P (Signed)
Preop H&P Note: Gyn-Onc  Consult was requested by Dr. Benjie Karvonen for the evaluation of Marissa Ballard 81 y.o. 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 VIN III    New patient    Assessment/Plan:  Marissa Ballard is a 81 y.o. 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 I am recommending wide local excision to rule out invasive carcinoma. I discussed with the patient that there may be loss of clitoral function and removal of the clitoris associated with this procedure. I discussed operative risks including risk of wound separation and infection. Her diabetes sounds well controlled. I recommend application of neosporin ointment on incision to abate some of the discomfort from the ulceration.   HPI: Marissa Ballard is a 81 -year-old woman who is seen in consultation at the request of Dr. Benjie Karvonen for VIN 3. The patient has a history of several months of anterior vulvar pain. She was evaluated by Dr. Benjie Karvonen on 08/03/2015 and was noted to have an anterior vulvar lesion that was ulcerated and very tender. A biopsy was performed of the lesion which revealed VIN 3 with patchy chronic inflammation.  The patient notes discomfort with urination and burning on the skin.  She is otherwise treated for type 2 diabetes mellitus. She reports her last HbA1c was 7%. She is not sexually active. She is a nonsmoker.    Current Meds:  Outpatient Encounter Prescriptions as of 08/20/2015  Medication Sig  . amLODipine (NORVASC) 10 MG tablet Take 1 tablet (10 mg total) by mouth daily.  Marland Kitchen levothyroxine (SYNTHROID, LEVOTHROID) 125 MCG tablet Take 1 tablet (125 mcg total) by mouth daily before breakfast.  . metFORMIN (GLUCOPHAGE) 1000 MG tablet Take 1,000 mg by mouth daily.  Marland Kitchen sulfamethoxazole-trimethoprim (BACTRIM DS,SEPTRA DS) 800-160 MG per tablet Take 1 tablet by mouth 2 (two) times daily.   No facility-administered encounter medications on file as of 08/20/2015.     Allergy: No Known Allergies  Social Hx:  Social History   Social History  . Marital Status: Married    Spouse Name: N/A  . Number of Children: N/A  . Years of Education: N/A   Occupational History  . Not on file.   Social History Main Topics  . Smoking status: Never Smoker   . Smokeless tobacco: Not on file  . Alcohol Use: No  . Drug Use: Not on file  . Sexual Activity: Not on file   Other Topics Concern  . 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 Appendectomy  1952  . Abdominal hysterectomy  1972    Past Medical Hx:  Past Medical History  Diagnosis Date  . Diabetes ([place])   . Thyroid disease     Past Gynecological History: No LMP recorded. Patient is postmenopausal.  Family Hx:  Family History  Problem Relation Age of Onset  . Breast cancer Mother   . Heart disease Mother   .  Heart attack Father 21  . Cancer Sister     ? cervical ca  . Cancer Sister     Review of Systems:  Constitutional  Feels well,  ENT Normal appearing ears and nares bilaterally Skin/Breast  No rash, sores, jaundice, itching, dryness Cardiovascular  No chest pain, shortness of breath, or edema  Pulmonary  No cough or wheeze.  Gastro Intestinal  No nausea, vomitting, or diarrhoea. No bright red blood per rectum, no abdominal pain, change in bowel movement, or constipation.  Genito Urinary  No frequency, urgency, +dysuria,  Musculo Skeletal  No  myalgia, arthralgia, joint swelling or pain  Neurologic  No weakness, numbness, change in gait,  Psychology  No depression, anxiety, insomnia.   Vitals: Blood pressure 180/60, pulse 58, temperature 98.1 F (36.7 C), temperature source Oral, resp. rate 17, height 5\' 1"  (1.549 m), weight 187 lb 11.2 oz (85.14 kg), SpO2 100 %.  Physical Exam: WD in NAD Neck  Supple NROM, without any enlargements.  Lymph Node Survey No cervical supraclavicular or inguinal adenopathy Cardiovascular  Pulse normal rate, regularity and rhythm. S1 and S2 normal.  Lungs  Clear to auscultation bilateraly, without wheezes/crackles/rhonchi. Good air movement.  Skin  No rash/lesions/breakdown  Psychiatry  Alert and oriented to person, place, and time  Abdomen  Normoactive bowel sounds, abdomen soft, non-tender and obese without evidence of hernia.  Back No CVA tenderness Genito Urinary  Vulva/vagina: 4-5cm anterior vulvar ulcerated lesion, very tender, covers clitoris, 1.5cm from urethral meatus. No nodularity.  Rectal  deferred Extremities  No bilateral cyanosis, clubbing or edema.   Donaciano Eva, MD

## 2015-11-09 NOTE — Op Note (Signed)
PATIENT: Marissa Ballard [id number] DATE: 11/09/15   Preop Diagnosis: VIN3  Postoperative Diagnosis: VIN3  Surgery: Partial simple anterior vulvectomy  Surgeons:  Donaciano Eva, MD Assistant: none  Anesthesia: General   Estimated blood loss: 80ml  IVF:  235ml   Urine output: 123XX123 ml   Complications: None   Pathology: anterior vulva with marking stitch at 12 o'clock midline, clitoral glans  Operative findings: erythematous ulcerated area measuring 5x5cm anterior vulva including clitoral glans  Procedure: The patient was identified in the preoperative holding area. Informed consent was signed on the chart. Patient was seen history was reviewed and exam was performed.   The patient was then taken to the operating room and placed in the supine position with SCD hose on. MAC anesthesia was then induced without difficulty. She was then placed in the dorsolithotomy position. The perineum was prepped with Betadine. The vagina was prepped with Betadine. The patient was then draped after the prep was dried. A Foley catheter was inserted into the bladder under sterile conditions.  Timeout was performed the patient, procedure, antibiotic, allergy, and length of procedure. 5% acetic acid solution was applied to the perineum. The vulvar tissues were inspected for areas of acetowhite changes or leukoplakia. The lesion was identified and the marking pen was used to circumscribe the area with appropriate surgical margins. The subcuticular tissues were infiltrated with 1% lidocaine. The 15 blade scalpel was used to make an incision through the skin circumferentially as marked. The skin elipse was grasped and was separated from the underlying deep dermal tissues with the bovie device. After the specimen had been completely resected, it was oriented and marked at 12 o'clock with a 0-vicryl suture. The bovie was used to obtain hemostasis at the surgical bed. The subcutaneous tissues were irrigated and  made hemostatic.   The deep dermal layer was approximated with 3-0vicryl mattress sutures to bring the skin edges into approximation and off tension. The wound was closed following langher's lines. The cutaneous layer was closed with interrupted 4-0 vicryl stitches and mattress sutures to ensure a tension free and hemostatic closure. The perineum was again irrigated. The foley was removed.  All instrument, suture, laparotomy, Ray-Tec, and needle counts were correct x2. The patient tolerated the procedure well and was taken recovery room in stable condition. This is Everitt Amber dictating an operative note on [facility].  Donaciano Eva, MD

## 2015-11-09 NOTE — Transfer of Care (Signed)
Immediate Anesthesia Transfer of Care Note  Patient: Marissa Ballard  Procedure(s) Performed: Procedure(s): WIDE  LOCAL EXCISION VULVA  (N/A)  Patient Location: PACU  Anesthesia Type:MAC  Level of Consciousness: awake, alert , oriented and patient cooperative  Airway & Oxygen Therapy: Patient Spontanous Breathing and Patient connected to nasal cannula oxygen  Post-op Assessment: Report given to RN and Post -op Vital signs reviewed and stable  Post vital signs: Reviewed and stable  Last Vitals:  Filed Vitals:   11/09/15 0754  BP: 202/63  Pulse: 52  Temp: 36.9 C  Resp: 16    Complications: No apparent anesthesia complications

## 2015-11-09 NOTE — Anesthesia Postprocedure Evaluation (Signed)
Anesthesia Post Note  Patient: Marissa Ballard  Procedure(s) Performed: Procedure(s) (LRB): WIDE  LOCAL EXCISION VULVA  (N/A)  Patient location during evaluation: PACU Anesthesia Type: MAC Level of consciousness: awake and alert Pain management: pain level controlled Vital Signs Assessment: post-procedure vital signs reviewed and stable Respiratory status: spontaneous breathing, nonlabored ventilation, respiratory function stable and patient connected to nasal cannula oxygen Cardiovascular status: blood pressure returned to baseline and stable Postop Assessment: no signs of nausea or vomiting Anesthetic complications: no    Last Vitals:  Filed Vitals:   11/09/15 1130 11/09/15 1145  BP: 183/67   Pulse: 48 60  Temp:    Resp: 16 15    Last Pain:  Filed Vitals:   11/09/15 1146  PainSc: 7                  Loleta Frommelt L

## 2015-11-09 NOTE — Anesthesia Procedure Notes (Signed)
Procedure Name: MAC Date/Time: 11/09/2015 10:03 AM Performed by: Wanita Chamberlain Pre-anesthesia Checklist: Patient identified, Timeout performed, Emergency Drugs available, Suction available and Patient being monitored Patient Re-evaluated:Patient Re-evaluated prior to inductionOxygen Delivery Method: Nasal cannula Preoxygenation: Pre-oxygenation with 100% oxygen Intubation Type: IV induction

## 2015-11-09 NOTE — Discharge Instructions (Signed)
Vulvectomy, Care After °The vulva is the external female genitalia, outside and around the vagina and pubic bone. It consists of: °· The skin on, and in front of, the pubic bone. °· The clitoris. °· The labia majora (large lips) on the outside of the vagina. °· The labia minora (small lips) around the opening of the vagina. °· The opening and the skin in and around the vagina. °A vulvectomy is the removal of the tissue of the vulva, which sometimes includes removal of the lymph nodes and tissue in the groin areas. °These discharge instructions provide you with general information on caring for yourself after you leave the hospital. It is also important that you know the warning signs of complications, so that you can seek treatment. Please read the instructions outlined below and refer to this sheet in the next few weeks. Your caregiver may also give you specific information and medicines. If you have any questions or complications after discharge, please call your caregiver. °ACTIVITY °· Rest as much as possible the first two weeks after discharge. °· Arrange to have help from family or others with your daily activities when you go home. °· Avoid heavy lifting (more than 5 pounds), pushing, or pulling. °· If you feel tired, balance your activity with rest periods. °· Follow your caregiver's instruction about climbing stairs and driving a car. °· Increase activity gradually. °· Do not exercise until you have permission from your caregiver. °LEG AND FOOT CARE °If your doctor has removed lymph nodes from your groin area, there may be an increase in swelling of your legs and feet. You can help prevent swelling by doing the following: °· Elevate your legs while sitting or lying down. °· If your caregiver has ordered special stockings, wear them according to instructions. °· Avoid standing in one place for long periods of time. °· Call the physical therapy department if you have any questions about swelling or treatment  for swelling. °· Avoid salt in your diet. It can cause fluid retention and swelling. °· Do not cross your legs, especially when sitting. °NUTRITION °· You may resume your normal diet. °· Drink 6 to 8 glasses of fluids a day. °· Eat a healthy, balanced diet including portions of food from the meat (protein), milk, fruit, vegetable, and bread groups. °· Your caregiver may recommend you take a multivitamin with iron. °ELIMINATION °· You may notice that your stream of urine is at a different angle, and may tend to spray. Using a plastic funnel may help to decrease urine spray. °· If constipation occurs, drink more liquids, and add more fruits, vegetables, and bran to your diet. You may take a mild laxative, such as Milk of Magnesia, Metamucil, or a stool softener such as Colace, with permission from your caregiver. °HYGIENE °· You may shower and wash your hair. °· Check with your caregiver about tub baths. °· Do not add any bath oils or chemicals to your bath water, after you have permission to take baths. °· While passing urine, pour water from a bottle or spray over your vulva to dilute the urine as it passes the incision (this will decrease burning and discomfort). °· Clean yourself well after moving your bowels. °· After urinating, do not wipe. Dap or pat dry with toilet paper or a dry cleath soft cloth. °· A sitz bath will help keep your perineal area clean, reduce swelling, and provide comfort. °· Avoid wearing underpants for the first 2 weeks and wear loose skirts to   allow circulation of air around the incision °· You do not need to apply dressings, salves or lotions to the wound. °· The stitches are self-dissolving and will absorb and disappear over a couple of months (it is normal to notice the knot from the stitches on toilet paper after voiding). °HOME CARE INSTRUCTIONS  °· Apply a soft ice pack (or frozen bag of peas) to your perineum (vulva) every hour in the first 48 hours after surgery. This will reduce  swelling. °· Avoid activities that involve a lot of friction between your legs. °· Avoid wearing pants or underpants in the 1st 2 weeks (skirts are preferable). °· Take your temperature twice a day and record it, especially if you feel feverish or have chills. °· Follow your caregiver's instructions about medicines, activity, and follow-up appointments after surgery. °· Do not drink alcohol while taking pain medicine. °· Change your dressing as advised by your caregiver. °· You may take over-the-counter medicine for pain, recommended by your caregiver. °· If your pain is not relieved with medicine, call your caregiver. °· Do not take aspirin because it can cause bleeding. °· Do not douche or use tampons (use a nonperfumed sanitary pad). °· Do not have sexual intercourse until your caregiver gives you permission (typically 6 weeks postoperatively). Hugging, kissing, and playful sexual activity is fine with your caregiver's permission. °· Warm sitz baths, with your caregiver's permission, are helpful to control swelling and discomfort. °· Take showers instead of baths, until your caregiver gives you permission to take baths. °· You may take a mild medicine for constipation, recommended by your caregiver. Bran foods and drinking a lot of fluids will help with constipation. °· Make sure your family understands everything about your operation and recovery. °SEEK MEDICAL CARE IF:  °· You notice swelling and redness around the wound area. °· You notice a foul smell coming from the wound or on the surgical dressing. °· You notice the wound is separating. °· You have painful or bloody urination. °· You develop nausea and vomiting. °· You develop diarrhea. °· You develop a rash. °· You have a reaction or allergy from the medicine. °· You feel dizzy or light-headed. °· You need stronger pain medicine. °SEEK IMMEDIATE MEDICAL CARE IF:  °· You develop a temperature of 102° F (38.9° C) or higher. °· You pass out. °· You develop  leg or chest pain. °· You develop abdominal pain. °· You develop shortness of breath. °· You develop bleeding from the wound area. °· You see pus in the wound area. °MAKE SURE YOU:  °· Understand these instructions. °· Will watch your condition. °· Will get help right away if you are not doing well or get worse. °Document Released: 06/13/2004 Document Revised: 03/16/2014 Document Reviewed: 10/01/2009 °[employer]® Patient Information ©2015 [employer]. This information is not intended to replace advice given to you by your health care provider. Make sure you discuss any questions you have with your health care provider. °Post Anesthesia Home Care Instructions ° °Activity: °Get plenty of rest for the remainder of the day. A responsible adult should stay with you for 24 hours following the procedure.  °For the next 24 hours, DO NOT: °-Drive a car °-Operate machinery °-Drink alcoholic beverages °-Take any medication unless instructed by your physician °-Make any legal decisions or sign important papers. ° °Meals: °Start with liquid foods such as gelatin or soup. Progress to regular foods as tolerated. Avoid greasy, spicy, heavy foods. If nausea and/or vomiting occur, drink   only clear liquids until the nausea and/or vomiting subsides. Call your physician if vomiting continues. ° °Special Instructions/Symptoms: °Your throat may feel dry or sore from the anesthesia or the breathing tube placed in your throat during surgery. If this causes discomfort, gargle with warm salt water. The discomfort should disappear within 24 hours. ° °If you had a scopolamine patch placed behind your ear for the management of post- operative nausea and/or vomiting: ° °1. The medication in the patch is effective for 72 hours, after which it should be removed.  Wrap patch in a tissue and discard in the trash. Wash hands thoroughly with soap and water. °2. You may remove the patch earlier than 72 hours if you experience unpleasant side effects  which may include dry mouth, dizziness or visual disturbances. °3. Avoid touching the patch. Wash your hands with soap and water after contact with the patch. °  ° °

## 2015-11-09 NOTE — OR Nursing (Signed)
Patient stated she had no one to stay with her after surgery, Dr. Denman George notified. Dr. Denman George stated the patient did not need anyone to stay and we could proceed with surgery. Andrena Mews RN, 10/25/15 08:20

## 2015-11-10 ENCOUNTER — Encounter (HOSPITAL_BASED_OUTPATIENT_CLINIC_OR_DEPARTMENT_OTHER): Payer: Self-pay | Admitting: Gynecologic Oncology

## 2015-11-12 ENCOUNTER — Telehealth: Payer: Self-pay | Admitting: Gynecologic Oncology

## 2015-11-12 NOTE — Telephone Encounter (Signed)
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 Recommendation is for radical vulvectomy/reexcision and bilateral inguinal lymphadenectomy. Will shedule this for 1 month time after she has healed some from her last procedure.  She states she is doing well with no pain. Donaciano Eva, MD

## 2015-11-27 ENCOUNTER — Other Ambulatory Visit: Payer: Self-pay | Admitting: Family Medicine

## 2015-11-30 ENCOUNTER — Encounter (HOSPITAL_COMMUNITY): Payer: Self-pay | Admitting: Emergency Medicine

## 2015-11-30 ENCOUNTER — Emergency Department (HOSPITAL_COMMUNITY): Payer: Medicare Other

## 2015-11-30 ENCOUNTER — Inpatient Hospital Stay (HOSPITAL_COMMUNITY): Payer: Medicare Other

## 2015-11-30 ENCOUNTER — Inpatient Hospital Stay (HOSPITAL_COMMUNITY)
Admission: EM | Admit: 2015-11-30 | Discharge: 2015-12-15 | DRG: 064 | Disposition: E | Payer: Medicare Other | Attending: Internal Medicine | Admitting: Internal Medicine

## 2015-11-30 DIAGNOSIS — D071 Carcinoma in situ of vulva: Secondary | ICD-10-CM | POA: Diagnosis present

## 2015-11-30 DIAGNOSIS — E872 Acidosis: Secondary | ICD-10-CM | POA: Diagnosis present

## 2015-11-30 DIAGNOSIS — J690 Pneumonitis due to inhalation of food and vomit: Secondary | ICD-10-CM | POA: Diagnosis present

## 2015-11-30 DIAGNOSIS — I63529 Cerebral infarction due to unspecified occlusion or stenosis of unspecified anterior cerebral artery: Secondary | ICD-10-CM | POA: Diagnosis present

## 2015-11-30 DIAGNOSIS — I63511 Cerebral infarction due to unspecified occlusion or stenosis of right middle cerebral artery: Principal | ICD-10-CM | POA: Diagnosis present

## 2015-11-30 DIAGNOSIS — E1165 Type 2 diabetes mellitus with hyperglycemia: Secondary | ICD-10-CM | POA: Diagnosis present

## 2015-11-30 DIAGNOSIS — J9601 Acute respiratory failure with hypoxia: Secondary | ICD-10-CM | POA: Diagnosis present

## 2015-11-30 DIAGNOSIS — E875 Hyperkalemia: Secondary | ICD-10-CM | POA: Diagnosis present

## 2015-11-30 DIAGNOSIS — Z515 Encounter for palliative care: Secondary | ICD-10-CM | POA: Diagnosis present

## 2015-11-30 DIAGNOSIS — Z6832 Body mass index (BMI) 32.0-32.9, adult: Secondary | ICD-10-CM | POA: Diagnosis not present

## 2015-11-30 DIAGNOSIS — R1310 Dysphagia, unspecified: Secondary | ICD-10-CM | POA: Diagnosis present

## 2015-11-30 DIAGNOSIS — G936 Cerebral edema: Secondary | ICD-10-CM | POA: Diagnosis present

## 2015-11-30 DIAGNOSIS — I639 Cerebral infarction, unspecified: Secondary | ICD-10-CM | POA: Diagnosis present

## 2015-11-30 DIAGNOSIS — E039 Hypothyroidism, unspecified: Secondary | ICD-10-CM | POA: Diagnosis present

## 2015-11-30 DIAGNOSIS — I959 Hypotension, unspecified: Secondary | ICD-10-CM | POA: Diagnosis not present

## 2015-11-30 DIAGNOSIS — Z66 Do not resuscitate: Secondary | ICD-10-CM | POA: Diagnosis not present

## 2015-11-30 DIAGNOSIS — R000 Tachycardia, unspecified: Secondary | ICD-10-CM | POA: Diagnosis present

## 2015-11-30 DIAGNOSIS — R4020 Unspecified coma: Secondary | ICD-10-CM | POA: Diagnosis present

## 2015-11-30 DIAGNOSIS — Z7984 Long term (current) use of oral hypoglycemic drugs: Secondary | ICD-10-CM

## 2015-11-30 DIAGNOSIS — I63311 Cerebral infarction due to thrombosis of right middle cerebral artery: Secondary | ICD-10-CM | POA: Diagnosis not present

## 2015-11-30 DIAGNOSIS — I10 Essential (primary) hypertension: Secondary | ICD-10-CM

## 2015-11-30 DIAGNOSIS — Z452 Encounter for adjustment and management of vascular access device: Secondary | ICD-10-CM

## 2015-11-30 DIAGNOSIS — R2990 Unspecified symptoms and signs involving the nervous system: Secondary | ICD-10-CM

## 2015-11-30 DIAGNOSIS — I272 Other secondary pulmonary hypertension: Secondary | ICD-10-CM | POA: Diagnosis present

## 2015-11-30 DIAGNOSIS — N179 Acute kidney failure, unspecified: Secondary | ICD-10-CM | POA: Diagnosis present

## 2015-11-30 DIAGNOSIS — I6789 Other cerebrovascular disease: Secondary | ICD-10-CM | POA: Diagnosis not present

## 2015-11-30 DIAGNOSIS — G9340 Encephalopathy, unspecified: Secondary | ICD-10-CM

## 2015-11-30 DIAGNOSIS — I161 Hypertensive emergency: Secondary | ICD-10-CM | POA: Diagnosis present

## 2015-11-30 DIAGNOSIS — R001 Bradycardia, unspecified: Secondary | ICD-10-CM | POA: Diagnosis present

## 2015-11-30 DIAGNOSIS — R4701 Aphasia: Secondary | ICD-10-CM | POA: Diagnosis present

## 2015-11-30 DIAGNOSIS — E669 Obesity, unspecified: Secondary | ICD-10-CM | POA: Diagnosis present

## 2015-11-30 DIAGNOSIS — Z8249 Family history of ischemic heart disease and other diseases of the circulatory system: Secondary | ICD-10-CM | POA: Diagnosis not present

## 2015-11-30 DIAGNOSIS — Z79899 Other long term (current) drug therapy: Secondary | ICD-10-CM | POA: Diagnosis not present

## 2015-11-30 DIAGNOSIS — G9382 Brain death: Secondary | ICD-10-CM | POA: Diagnosis not present

## 2015-11-30 DIAGNOSIS — Z9289 Personal history of other medical treatment: Secondary | ICD-10-CM

## 2015-11-30 DIAGNOSIS — G935 Compression of brain: Secondary | ICD-10-CM | POA: Diagnosis present

## 2015-11-30 DIAGNOSIS — G8194 Hemiplegia, unspecified affecting left nondominant side: Secondary | ICD-10-CM | POA: Diagnosis present

## 2015-11-30 LAB — CBC WITH DIFFERENTIAL/PLATELET
BASOS PCT: 0 %
Basophils Absolute: 0 10*3/uL (ref 0.0–0.1)
EOS ABS: 0 10*3/uL (ref 0.0–0.7)
Eosinophils Relative: 0 %
HEMATOCRIT: 40.3 % (ref 36.0–46.0)
HEMOGLOBIN: 13.3 g/dL (ref 12.0–15.0)
LYMPHS ABS: 1.7 10*3/uL (ref 0.7–4.0)
Lymphocytes Relative: 12 %
MCH: 27.3 pg (ref 26.0–34.0)
MCHC: 33 g/dL (ref 30.0–36.0)
MCV: 82.8 fL (ref 78.0–100.0)
MONOS PCT: 3 %
Monocytes Absolute: 0.5 10*3/uL (ref 0.1–1.0)
NEUTROS ABS: 12.5 10*3/uL — AB (ref 1.7–7.7)
NEUTROS PCT: 85 %
Platelets: 238 10*3/uL (ref 150–400)
RBC: 4.87 MIL/uL (ref 3.87–5.11)
RDW: 17.5 % — ABNORMAL HIGH (ref 11.5–15.5)
WBC: 14.7 10*3/uL — AB (ref 4.0–10.5)

## 2015-11-30 LAB — COMPREHENSIVE METABOLIC PANEL
ALBUMIN: 3.5 g/dL (ref 3.5–5.0)
ALK PHOS: 52 U/L (ref 38–126)
ALT: 36 U/L (ref 14–54)
ALT: 32 U/L (ref 14–54)
ANION GAP: 9 (ref 5–15)
AST: 37 U/L (ref 15–41)
AST: 38 U/L (ref 15–41)
Albumin: 3.9 g/dL (ref 3.5–5.0)
Alkaline Phosphatase: 58 U/L (ref 38–126)
Anion gap: 13 (ref 5–15)
BILIRUBIN TOTAL: 1.3 mg/dL — AB (ref 0.3–1.2)
BUN: 35 mg/dL — ABNORMAL HIGH (ref 6–20)
BUN: 33 mg/dL — AB (ref 6–20)
CALCIUM: 9.4 mg/dL (ref 8.9–10.3)
CHLORIDE: 105 mmol/L (ref 101–111)
CO2: 23 mmol/L (ref 22–32)
CO2: 16 mmol/L — ABNORMAL LOW (ref 22–32)
CREATININE: 1.92 mg/dL — AB (ref 0.44–1.00)
Calcium: 9 mg/dL (ref 8.9–10.3)
Chloride: 108 mmol/L (ref 101–111)
Creatinine, Ser: 1.71 mg/dL — ABNORMAL HIGH (ref 0.44–1.00)
GFR calc Af Amer: 32 mL/min — ABNORMAL LOW (ref >60)
GFR calc non Af Amer: 27 mL/min — ABNORMAL LOW (ref >60)
GFR, EST AFRICAN AMERICAN: 27 mL/min — AB (ref >60)
GFR, EST NON AFRICAN AMERICAN: 24 mL/min — AB (ref >60)
GLUCOSE: 298 mg/dL — AB (ref 65–99)
Glucose, Bld: 267 mg/dL — ABNORMAL HIGH (ref 65–99)
POTASSIUM: 5.9 mmol/L — AB (ref 3.5–5.1)
Potassium: 5.7 mmol/L — ABNORMAL HIGH (ref 3.5–5.1)
SODIUM: 137 mmol/L (ref 135–145)
Sodium: 137 mmol/L (ref 135–145)
TOTAL PROTEIN: 7 g/dL (ref 6.5–8.1)
Total Bilirubin: 0.5 mg/dL (ref 0.3–1.2)
Total Protein: 7.9 g/dL (ref 6.5–8.1)

## 2015-11-30 LAB — URINALYSIS, ROUTINE W REFLEX MICROSCOPIC
BILIRUBIN URINE: NEGATIVE
Glucose, UA: 250 mg/dL — AB
KETONES UR: NEGATIVE mg/dL
Leukocytes, UA: NEGATIVE
NITRITE: NEGATIVE
PH: 6 (ref 5.0–8.0)
PROTEIN: 100 mg/dL — AB
SPECIFIC GRAVITY, URINE: 1.015 (ref 1.005–1.030)

## 2015-11-30 LAB — TYPE AND SCREEN
ABO/RH(D): O POS
Antibody Screen: NEGATIVE

## 2015-11-30 LAB — I-STAT ARTERIAL BLOOD GAS, ED
ACID-BASE DEFICIT: 6 mmol/L — AB (ref 0.0–2.0)
Bicarbonate: 21.2 mEq/L (ref 20.0–24.0)
O2 SAT: 97 %
TCO2: 23 mmol/L (ref 0–100)
pCO2 arterial: 45.6 mmHg — ABNORMAL HIGH (ref 35.0–45.0)
pH, Arterial: 7.275 — ABNORMAL LOW (ref 7.350–7.450)
pO2, Arterial: 107 mmHg — ABNORMAL HIGH (ref 80.0–100.0)

## 2015-11-30 LAB — I-STAT CG4 LACTIC ACID, ED: LACTIC ACID, VENOUS: 2.79 mmol/L — AB (ref 0.5–2.0)

## 2015-11-30 LAB — PROTIME-INR
INR: 1.05 (ref 0.00–1.49)
PROTHROMBIN TIME: 13.9 s (ref 11.6–15.2)

## 2015-11-30 LAB — CBG MONITORING, ED
GLUCOSE-CAPILLARY: 279 mg/dL — AB (ref 65–99)
Glucose-Capillary: 252 mg/dL — ABNORMAL HIGH (ref 65–99)

## 2015-11-30 LAB — URINE MICROSCOPIC-ADD ON: Bacteria, UA: NONE SEEN

## 2015-11-30 LAB — GLUCOSE, CAPILLARY
GLUCOSE-CAPILLARY: 210 mg/dL — AB (ref 65–99)
Glucose-Capillary: 251 mg/dL — ABNORMAL HIGH (ref 65–99)

## 2015-11-30 LAB — ABO/RH: ABO/RH(D): O POS

## 2015-11-30 LAB — TROPONIN I: TROPONIN I: 0.03 ng/mL (ref <0.031)

## 2015-11-30 LAB — SODIUM: Sodium: 136 mmol/L (ref 135–145)

## 2015-11-30 MED ORDER — PROPOFOL 1000 MG/100ML IV EMUL
INTRAVENOUS | Status: AC
  Filled 2015-11-30: qty 100

## 2015-11-30 MED ORDER — INSULIN ASPART 100 UNIT/ML ~~LOC~~ SOLN
0.0000 [IU] | SUBCUTANEOUS | Status: DC
  Administered 2015-11-30: 8 [IU] via SUBCUTANEOUS
  Administered 2015-12-01: 5 [IU] via SUBCUTANEOUS
  Administered 2015-12-01: 2 [IU] via SUBCUTANEOUS
  Administered 2015-12-01: 3 [IU] via SUBCUTANEOUS
  Administered 2015-12-01: 2 [IU] via SUBCUTANEOUS
  Administered 2015-12-01: 3 [IU] via SUBCUTANEOUS
  Administered 2015-12-01 – 2015-12-02 (×2): 5 [IU] via SUBCUTANEOUS
  Filled 2015-11-30: qty 1

## 2015-11-30 MED ORDER — ASPIRIN 325 MG PO TABS
325.0000 mg | ORAL_TABLET | Freq: Every day | ORAL | Status: DC
  Administered 2015-12-02: 325 mg
  Filled 2015-11-30: qty 1

## 2015-11-30 MED ORDER — ROCURONIUM BROMIDE 50 MG/5ML IV SOLN
INTRAVENOUS | Status: AC | PRN
  Administered 2015-11-30: 100 mg via INTRAVENOUS

## 2015-11-30 MED ORDER — FENTANYL CITRATE (PF) 100 MCG/2ML IJ SOLN: 50.0000 ug | INTRAMUSCULAR | Status: DC | PRN

## 2015-11-30 MED ORDER — CHLORHEXIDINE GLUCONATE 0.12% ORAL RINSE (MEDLINE KIT)
15.0000 mL | Freq: Two times a day (BID) | OROMUCOSAL | Status: DC
  Administered 2015-11-30 – 2015-12-02 (×4): 15 mL via OROMUCOSAL

## 2015-11-30 MED ORDER — ASPIRIN 300 MG RE SUPP
300.0000 mg | Freq: Every day | RECTAL | Status: DC
  Administered 2015-11-30 – 2015-12-01 (×2): 300 mg via RECTAL
  Filled 2015-11-30 (×2): qty 1

## 2015-11-30 MED ORDER — NICARDIPINE HCL IN NACL 20-0.86 MG/200ML-% IV SOLN
5.0000 mg/h | Freq: Once | INTRAVENOUS | Status: AC
  Administered 2015-11-30: 5 mg/h via INTRAVENOUS
  Filled 2015-11-30: qty 200

## 2015-11-30 MED ORDER — SODIUM CHLORIDE 0.9 % IV SOLN
1.5000 g | Freq: Two times a day (BID) | INTRAVENOUS | Status: DC
  Administered 2015-11-30 – 2015-12-02 (×4): 1.5 g via INTRAVENOUS
  Filled 2015-11-30 (×6): qty 1.5

## 2015-11-30 MED ORDER — SODIUM POLYSTYRENE SULFONATE 15 GM/60ML PO SUSP
30.0000 g | Freq: Once | ORAL | Status: AC
  Administered 2015-11-30: 30 g via ORAL
  Filled 2015-11-30: qty 120

## 2015-11-30 MED ORDER — PROPOFOL 1000 MG/100ML IV EMUL
10.0000 ug/kg/min | INTRAVENOUS | Status: DC
  Administered 2015-11-30: 20 ug/kg/min via INTRAVENOUS

## 2015-11-30 MED ORDER — DOPAMINE-DEXTROSE 3.2-5 MG/ML-% IV SOLN
INTRAVENOUS | Status: AC
  Administered 2015-11-30: 8 ug/kg/min via INTRAVENOUS
  Filled 2015-11-30: qty 250

## 2015-11-30 MED ORDER — LEVOTHYROXINE SODIUM 125 MCG PO TABS
125.0000 ug | ORAL_TABLET | Freq: Every day | ORAL | Status: DC
  Administered 2015-12-01 – 2015-12-02 (×2): 125 ug
  Filled 2015-11-30 (×3): qty 1

## 2015-11-30 MED ORDER — ATROPINE SULFATE 1 MG/ML IJ SOLN
INTRAMUSCULAR | Status: DC | PRN
  Administered 2015-11-30: .5 mg via INTRAVENOUS

## 2015-11-30 MED ORDER — SODIUM CHLORIDE 0.9 % IV BOLUS (SEPSIS)
1000.0000 mL | INTRAVENOUS | Status: DC
  Administered 2015-11-30: 1000 mL via INTRAVENOUS

## 2015-11-30 MED ORDER — VITAL HIGH PROTEIN PO LIQD: 1000.0000 mL | ORAL | Status: DC

## 2015-11-30 MED ORDER — PANTOPRAZOLE SODIUM 40 MG IV SOLR
40.0000 mg | Freq: Every day | INTRAVENOUS | Status: DC
  Administered 2015-11-30 – 2015-12-02 (×3): 40 mg via INTRAVENOUS
  Filled 2015-11-30 (×3): qty 40

## 2015-11-30 MED ORDER — SODIUM CHLORIDE 0.9 % IV SOLN
1.0000 g | Freq: Once | INTRAVENOUS | Status: AC
  Administered 2015-11-30: 1 g via INTRAVENOUS
  Filled 2015-11-30: qty 10

## 2015-11-30 MED ORDER — ETOMIDATE 2 MG/ML IV SOLN
INTRAVENOUS | Status: AC | PRN
  Administered 2015-11-30: 30 mg via INTRAVENOUS

## 2015-11-30 MED ORDER — INSULIN ASPART 100 UNIT/ML IV SOLN
10.0000 [IU] | Freq: Once | INTRAVENOUS | Status: AC
  Administered 2015-11-30: 10 [IU] via INTRAVENOUS
  Filled 2015-11-30 (×2): qty 1

## 2015-11-30 MED ORDER — PROPOFOL 1000 MG/100ML IV EMUL
5.0000 ug/kg/min | INTRAVENOUS | Status: DC
  Administered 2015-11-30: 55 ug/kg/min via INTRAVENOUS
  Administered 2015-12-01: 50 ug/kg/min via INTRAVENOUS
  Administered 2015-12-01: 40 ug/kg/min via INTRAVENOUS
  Filled 2015-11-30 (×3): qty 100

## 2015-11-30 MED ORDER — HEPARIN SODIUM (PORCINE) 5000 UNIT/ML IJ SOLN
5000.0000 [IU] | Freq: Three times a day (TID) | INTRAMUSCULAR | Status: DC
  Administered 2015-11-30 – 2015-12-02 (×5): 5000 [IU] via SUBCUTANEOUS
  Filled 2015-11-30 (×4): qty 1

## 2015-11-30 MED ORDER — DEXTROSE 50 % IV SOLN
1.0000 | Freq: Once | INTRAVENOUS | Status: AC
  Administered 2015-11-30: 50 mL via INTRAVENOUS
  Filled 2015-11-30: qty 50

## 2015-11-30 MED ORDER — SODIUM CHLORIDE 0.9 % IV SOLN
INTRAVENOUS | Status: DC
  Administered 2015-11-30: 20:00:00 via INTRAVENOUS

## 2015-11-30 MED ORDER — DOPAMINE-DEXTROSE 3.2-5 MG/ML-% IV SOLN
0.0000 ug/kg/min | INTRAVENOUS | Status: DC
  Administered 2015-11-30: 5 ug/kg/min via INTRAVENOUS
  Administered 2015-11-30: 8 ug/kg/min via INTRAVENOUS

## 2015-11-30 MED ORDER — STROKE: EARLY STAGES OF RECOVERY BOOK
Freq: Once | Status: DC
  Filled 2015-11-30: qty 1

## 2015-11-30 MED ORDER — ATROPINE SULFATE 1 MG/ML IJ SOLN
INTRAMUSCULAR | Status: AC | PRN
  Administered 2015-11-30: .5 mg via INTRAVENOUS

## 2015-11-30 MED ORDER — ANTISEPTIC ORAL RINSE SOLUTION (CORINZ)
7.0000 mL | OROMUCOSAL | Status: DC
  Administered 2015-12-01 – 2015-12-02 (×15): 7 mL via OROMUCOSAL

## 2015-11-30 NOTE — Procedures (Signed)
Central Venous Catheter Insertion Procedure Note LUCRETIA CHATTEN [id number] 10-Nov-1934  Procedure: Insertion of Central Venous Catheter Indications: Drug and/or fluid administration and Frequent blood sampling  Procedure Details Consent: Risks of procedure as well as the alternatives and risks of each were explained to the (patient/caregiver).  Consent for procedure obtained. Time Out: Verified patient identification, verified procedure, site/side was marked, verified correct patient position, special equipment/implants available, medications/allergies/relevent history reviewed, required imaging and test results available.  Performed Real time [place] was used to ID and cannulate vessel Maximum sterile technique was used including antiseptics, cap, gloves, gown, hand hygiene, mask and sheet. Skin prep: Chlorhexidine; local anesthetic administered A antimicrobial bonded/coated triple lumen catheter was placed in the left internal jugular vein using the Seldinger technique.  Evaluation Blood flow good Complications: No apparent complications Patient did tolerate procedure well. Chest X-ray ordered to verify placement.  CXR: pending.  Clementeen Graham 11/21/2015, 4:19 PM  Rush Farmer, M.D. [facility]. Pager: [phone]. After hours pager: [phone].

## 2015-11-30 NOTE — ED Notes (Signed)
Dr. Nelda Marseille made aware of pts HR. No recommendations at this time.

## 2015-11-30 NOTE — Progress Notes (Signed)
EEG unable to be performed at this time, ok to wait until 12/01/15 per Dr Armida Sans in neurology.

## 2015-11-30 NOTE — Progress Notes (Signed)
Patient transported to CT and back to TRA-B. Vitals stable.

## 2015-11-30 NOTE — Progress Notes (Signed)
eLink Physician-Brief Progress Note Patient Name: Marissa Ballard DOB: [birth year] MRN: [id number]   Date of Service  12/10/2015  HPI/Events of Note  Severe sinus bradycardia  eICU Interventions  DA gtt to maintain HR > 40/min     Intervention Category Major Interventions: Arrhythmia - evaluation and management  Merton Border 11/29/2015, 7:58 PM

## 2015-11-30 NOTE — Progress Notes (Signed)
ANTIBIOTIC CONSULT NOTE - INITIAL  Pharmacy Consult for Unasyn Indication: aspiration PNA  No Known Allergies  Patient Measurements: Weight: 182 lb 1.6 oz (82.6 kg) Adjusted Body Weight:   Vital Signs: Temp: 97.5 F (36.4 C) (01/17 1430) BP: 200/74 mmHg (01/17 1430) Pulse Rate: 56 (01/17 1430) Intake/Output from previous day:   Intake/Output from this shift: Total I/O In: 1000 [I.V.:1000] Out: 500 [Urine:500]  Labs:  Recent Labs  11/28/2015 1242  WBC 14.7*  HGB 13.3  PLT 238  CREATININE 1.92*   Estimated Creatinine Clearance: 23.7 mL/min (by C-G formula based on Cr of 1.92). No results for input(s): VANCOTROUGH, VANCOPEAK, VANCORANDOM, GENTTROUGH, GENTPEAK, GENTRANDOM, TOBRATROUGH, TOBRAPEAK, TOBRARND, AMIKACINPEAK, AMIKACINTROU, AMIKACIN in the last 72 hours.   Microbiology: No results found for this or any previous visit (from the past 720 hour(s)).  Medical History: Past Medical History  Diagnosis Date  . Hypothyroidism   . Type 2 diabetes mellitus ([place])   . VIN III (vulvar intraepithelial neoplasia III)   . Hypertension   . History of pneumonia     CAP  09-07-2015  . Otitis media of left ear     10-26-2015   Assessment: 35 yof intubated in ED, hypertensive. Neuro consulted. Pharmacy consulted to dose Unasyn for aspiration PNA. Afeb, wbc 14.7.  1/17 Unasyn>>  1/17 BCx2>> 1/17 Resp>>  Goal of Therapy:  Eradication of infection  Plan:  Unasyn 1.5g IV q12h Monitor clinical progress, c/s, renal function, abx plan/LOT  Elicia Lamp, PharmD, [facility] Clinical Pharmacist Pager [phone] 11/23/2015 2:46 PM

## 2015-11-30 NOTE — ED Notes (Signed)
Dr. Alva Garnet made aware of pts HR, BP and that Cardene was stopped. No recommendations at this time.

## 2015-11-30 NOTE — ED Notes (Signed)
Pt was taken to CT by this RN, and respiratory. Pt tolerated well on vent. Returned to room without difficulty. Neuro MD at bedside. will continue to monitor.

## 2015-11-30 NOTE — Consult Note (Addendum)
Referring Physician: Dr Dayna Barker    Chief Complaint: altered mental status, right gaze preference, left hemiparesis  HPI:                                                                                                                                         Marissa Ballard is an 82 y.o. female with a past medical history significant for HTN, DM type 2, vulvar cancer stage III, and hypothyroidism, brought in by EMS from home after being found by son lying in bed covered in feces unable to move left side. Patient was intubated on the ED, markedly hypertensive with SBP > 250. Currently, she is intubated on the vent, sedated with propofol. CT brain revealed a hyperdense right MCA sign with as well as a large acute to subacute right hemispheric infarct without hemorrhagic transformation or midline shift.There is also likely an acute or subacute infarct in the left cerebellum.  Date last known well: presumably 2 days ago Time last known well: unable to determine tPA Given: no, late presentation   Past Medical History  Diagnosis Date  . Hypothyroidism   . Type 2 diabetes mellitus ([place])   . VIN III (vulvar intraepithelial neoplasia III)   . Hypertension   . History of pneumonia     CAP  09-07-2015  . Otitis media of left ear     10-26-2015    Past Surgical History  Procedure Laterality Date  . Appendectomy  1952  . Abdominal hysterectomy  1972  . Shoulder arthroscopy Right 2000    impingement  . Cataract extraction w/ intraocular lens implant Left   . Vulvectomy N/A 11/09/2015    Procedure: WIDE  LOCAL EXCISION VULVA ;  Surgeon: Everitt Amber, MD;  Location: [facility];  Service: Gynecology;  Laterality: N/A;    Family History  Problem Relation Age of Onset  . Breast cancer Mother   . Heart disease Mother   . Heart attack Father 59  . Cancer Sister     ? cervical ca  . Cancer Sister    Social History:  reports that she has never smoked. She has never used smokeless  tobacco. She reports that she does not drink alcohol or use illicit drugs.  Allergies: No Known Allergies  Medications:  I have reviewed the patient's current medications.  ROS: unable to obtain due to mental status                                                                                                                                      History obtained from chart review   Physical exam:  Constitutional: critically ill female, on propofol, intubated on the vent. Blood pressure 255/79, pulse 53, resp. rate 16, weight 82.6 kg (182 lb 1.6 oz), SpO2 100 %. Eyes: no jaundice or exophthalmos.  Head: normocephalic. Neck: supple, no bruits, no JVD. Cardiac: no murmurs. Lungs: clear. Abdomen: soft, no tender, no mass. Extremities: no edema, clubbing, or cyanosis.  Skin: no rash  Neurologic Examination:                                                                                                      On propofol. MS: on propofol, intubated on the vent CN 2-12: pupils 4 mm, reactive.  Motor: on propofol Sensory: no reaction to pain but on propofol DTR's: unable to elicit Plantars: mute Coordination and gait: unable to test at this moment  Results for orders placed or performed during the hospital encounter of 11/28/2015 (from the past 48 hour(s))  CBC WITH DIFFERENTIAL     Status: Abnormal   Collection Time: 12/08/2015 12:42 PM  Result Value Ref Range   WBC 14.7 (H) 4.0 - 10.5 K/uL   RBC 4.87 3.87 - 5.11 MIL/uL   Hemoglobin 13.3 12.0 - 15.0 g/dL   HCT 40.3 36.0 - 46.0 %   MCV 82.8 78.0 - 100.0 fL   MCH 27.3 26.0 - 34.0 pg   MCHC 33.0 30.0 - 36.0 g/dL   RDW 17.5 (H) 11.5 - 15.5 %   Platelets 238 150 - 400 K/uL   Neutrophils Relative % 85 %   Neutro Abs 12.5 (H) 1.7 - 7.7 K/uL   Lymphocytes Relative 12 %   Lymphs Abs 1.7 0.7 - 4.0 K/uL   Monocytes  Relative 3 %   Monocytes Absolute 0.5 0.1 - 1.0 K/uL   Eosinophils Relative 0 %   Eosinophils Absolute 0.0 0.0 - 0.7 K/uL   Basophils Relative 0 %   Basophils Absolute 0.0 0.0 - 0.1 K/uL  Protime-INR     Status: None   Collection Time: 11/24/2015 12:42 PM  Result Value Ref Range   Prothrombin Time 13.9 11.6 - 15.2 seconds   INR 1.05 0.00 - 1.49  I-Stat CG4 Lactic  Acid, ED  (not at  [facility])     Status: Abnormal   Collection Time: 12/08/2015 12:54 PM  Result Value Ref Range   Lactic Acid, Venous 2.79 (HH) 0.5 - 2.0 mmol/L   Comment NOTIFIED PHYSICIAN    Ct Head Wo Contrast  11/20/2015  CLINICAL DATA:  The patient is unable to move her left-side today shows discovered by her son and the head today. EXAM: CT HEAD WITHOUT CONTRAST TECHNIQUE: Contiguous axial images were obtained from the base of the skull through the vertex without intravenous contrast. COMPARISON:  None. FINDINGS: There is edema throughout the right MCA territory consistent with massive infarct. Low attenuation is also identified in the left cerebellar hemisphere. No hemorrhage, midline shift or mass is identified. Remote, small occipital infarct is noted. There is no hydrocephalus or pneumocephalus. The calvarium is intact. IMPRESSION: Findings consistent with massive acute or subacute right MCA infarct without hemorrhagic transformation. There is also likely an acute or subacute infarct in the left cerebellum. These results were called by telephone at the time of interpretation on 11/28/2015 at 1:20 pm to Dr. Merrily Pew , who verbally acknowledged these results. Electronically Signed   By: Inge Rise M.D.   On: 11/14/2015 13:21   Dg Chest Portable 1 View  11/28/2015  CLINICAL DATA:  Check ETT placement EXAM: PORTABLE CHEST 1 VIEW COMPARISON:  None. FINDINGS: Endotracheal tube with the tip in the right mainstem bronchus. Nasogastric tube coursing below the diaphragm. Bilateral interstitial and patchy alveolar airspace opacities.  No pleural effusion or pneumothorax. Stable cardiomediastinal silhouette. No acute osseous abnormality. IMPRESSION: Endotracheal tube with the tip in the right mainstem bronchus. Recommend retracting the endotracheal tube 6 cm. Bilateral interstitial and alveolar airspace opacities which may reflect pulmonary edema versus multi lobar pneumonia. These results were called by telephone at the time of interpretation on 11/17/2015 at 1:11 pm to Dr. Merrily Pew , who verbally acknowledged these results. Electronically Signed   By: Kathreen Devoid   On: 11/23/2015 13:11   Assessment: 82 y.o. female brought in by EMS after being found unresponsive at home with left hemiparesis and right gaze preference. CT head reveals a dense right MCA with large acute/subacute hemispheric infarct without hemorrhagic transformation or significant mass effect which is quite likely embolic. Patient was last known well presumably 2 days ago, hence at this moment she is not a candidate or IV thrombolysis or endovascular intervention. Very large infarct that on CT seems to occupy the entirety of the cortical right MCA territory, thus concern for potential development of malignant cerebral edema. Conservative management. Can treat with nicardipine if SBP remains above 220. Will favor starting hypertonic saline even knowing that presently there is not significant mass effect on CT. Unclear if she had an early stroke related seizure but agree with EEG to exclude the presence of superimposed NCSE. Admit to NICU. Complete stroke work up. Stroke team will follow up tomorrow. Son is at the bedside and was updated regarding patient very critical condition and likely poor outcome from this catastrophic event. Stroke Risk Factors - age, HTN, DM  Plan: 1. HgbA1c, fasting lipid panel 2. MRI, MRA  of the brain without contrast 3. Echocardiogram 4. Carotid dopplers 5. Prophylactic therapy-aspirin rectally for now. 6. Risk factor  modification 7. Telemetry monitoring 8. Frequent neuro checks 9. PT/OT SLP  Dorian Pod, MD [facility] [phone]  12/05/2015, 1:27 PM

## 2015-11-30 NOTE — ED Notes (Signed)
Pete with Critical care at bedside.

## 2015-11-30 NOTE — Telephone Encounter (Signed)
LM is patient still taking metformin

## 2015-11-30 NOTE — ED Notes (Signed)
Marissa Ballard, Critical care at bedside. States to continue propofol. Increased to 14mcg/kg/min

## 2015-11-30 NOTE — H&P (Signed)
PULMONARY / CRITICAL CARE MEDICINE   Name: Marissa Ballard MRN: [id number] DOB: 1934-02-12    ADMISSION DATE:  11/28/2015 CONSULTATION DATE: 12/08/2015  REFERRING MD:  Mesener   CHIEF COMPLAINT:  Acute Stroke   HISTORY OF PRESENT ILLNESS:   82 year old female admitted  presented to ED 11/18/2015 after son called EMS when patient was found lying on bed, altered w/ inability to move left side.. On EMS arrival found to be hypoxic (saturation 70s on room air), Bradycardiac (HR 20s).   Patient was intubated by ED attending shortly after arrival. PCCM to admit.   PAST MEDICAL HISTORY :  She  has a past medical history of Hypothyroidism; Type 2 diabetes mellitus ([place]); VIN III (vulvar intraepithelial neoplasia III); Hypertension; History of pneumonia; and Otitis media of left ear.  PAST SURGICAL HISTORY: She  has past surgical history that includes Appendectomy (1952); Abdominal hysterectomy (1972); Shoulder arthroscopy (Right, 2000); Cataract extraction w/ intraocular lens implant (Left); and Vulvectomy (N/A, 11/09/2015).  No Known Allergies  No current facility-administered medications on file prior to encounter.   Current Outpatient Prescriptions on File Prior to Encounter  Medication Sig  . amLODipine (NORVASC) 10 MG tablet Take 1 tablet (10 mg total) by mouth daily. (Patient taking differently: Take 10 mg by mouth every morning. )  . amoxicillin (AMOXIL) 500 MG capsule Take 1 capsule (500 mg total) by mouth 3 (three) times daily.  Marland Kitchen docusate sodium (COLACE) 100 MG capsule Take 1 capsule (100 mg total) by mouth 2 (two) times daily.  Marland Kitchen levothyroxine (SYNTHROID, LEVOTHROID) 125 MCG tablet Take 1 tablet (125 mcg total) by mouth daily before breakfast.  . metFORMIN (GLUCOPHAGE) 1000 MG tablet Take 1,000 mg by mouth every evening.   Marland Kitchen oxyCODONE-acetaminophen (PERCOCET) 5-325 MG tablet Take 1-2 tablets by mouth every 4 (four) hours as needed for severe pain.  . Vitamins A & D (VITAMIN A & D)  ointment Apply 1 application topically as needed (vulvar area).    FAMILY HISTORY:  Her has no family status information on file.   SOCIAL HISTORY: She  reports that she has never smoked. She has never used smokeless tobacco. She reports that she does not drink alcohol or use illicit drugs.  REVIEW OF SYSTEMS:   Unable to access.   SUBJECTIVE:  Unresponsive.   VITAL SIGNS: BP 253/118 mmHg  Pulse 53  Temp(Src) 97.7 F (36.5 C)  Resp 21  Wt 82.6 kg (182 lb 1.6 oz)  SpO2 100%  HEMODYNAMICS:    VENTILATOR SETTINGS: Vent Mode:  [-] PRVC FiO2 (%):  [100 %] 100 % Set Rate:  [16 bmp] 16 bmp Vt Set:  [400 mL] 400 mL PEEP:  [5 cmH20] 5 cmH20 Plateau Pressure:  [20 cmH20] 20 cmH20  INTAKE / OUTPUT:    PHYSICAL EXAMINATION: General: Critically ill female. Now sedated on vent Neuro:  On 2mcg/hr propofol, responds to painful stimuli, LUE weaker HEENT:  ETT, pupils 42mm bilaterally and non-reactive,  Cardiovascular:  Regular rate and rhythm, no edema  Lungs:  Diffused scattered rhonchi, not using accessory muscles Abdomen: Flat, non-distended, OG tube in place  Musculoskeletal:  No visible joint deformities  Skin:  Cool and intact   LABS:  BMET  Recent Labs Lab 12/03/2015 1242  NA 137  K 5.7*  CL 105  CO2 23  BUN 35*  CREATININE 1.92*  GLUCOSE 267*    Electrolytes  Recent Labs Lab 11/21/2015 1242  CALCIUM 9.4    CBC  Recent Labs  Lab 11/18/2015 1242  WBC 14.7*  HGB 13.3  HCT 40.3  PLT 238    Coag's  Recent Labs Lab 11/23/2015 1242  INR 1.05    Sepsis Markers  Recent Labs Lab 12/01/2015 1254  LATICACIDVEN 2.79*    ABG  Recent Labs Lab 12/04/2015 1330  PHART 7.275*  PCO2ART 45.6*  PO2ART 107.0*    Liver Enzymes  Recent Labs Lab 12/10/2015 1242  AST 37  ALT 36  ALKPHOS 58  BILITOT 0.5  ALBUMIN 3.9    Cardiac Enzymes  Recent Labs Lab 12/05/2015 1242  TROPONINI 0.03    Glucose No results for input(s): GLUCAP in the last 168  hours.  Imaging Ct Head Wo Contrast  11/16/2015  CLINICAL DATA:  The patient is unable to move her left-side today shows discovered by her son and the head today. EXAM: CT HEAD WITHOUT CONTRAST TECHNIQUE: Contiguous axial images were obtained from the base of the skull through the vertex without intravenous contrast. COMPARISON:  None. FINDINGS: There is edema throughout the right MCA territory consistent with massive infarct. Low attenuation is also identified in the left cerebellar hemisphere. No hemorrhage, midline shift or mass is identified. Remote, small occipital infarct is noted. There is no hydrocephalus or pneumocephalus. The calvarium is intact. IMPRESSION: Findings consistent with massive acute or subacute right MCA infarct without hemorrhagic transformation. There is also likely an acute or subacute infarct in the left cerebellum. These results were called by telephone at the time of interpretation on 12/06/2015 at 1:20 pm to Dr. Merrily Pew , who verbally acknowledged these results. Electronically Signed   By: Inge Rise M.D.   On: 11/15/2015 13:21   Dg Chest Portable 1 View  11/25/2015  CLINICAL DATA:  Check ETT placement EXAM: PORTABLE CHEST 1 VIEW COMPARISON:  None. FINDINGS: Endotracheal tube with the tip in the right mainstem bronchus. Nasogastric tube coursing below the diaphragm. Bilateral interstitial and patchy alveolar airspace opacities. No pleural effusion or pneumothorax. Stable cardiomediastinal silhouette. No acute osseous abnormality. IMPRESSION: Endotracheal tube with the tip in the right mainstem bronchus. Recommend retracting the endotracheal tube 6 cm. Bilateral interstitial and alveolar airspace opacities which may reflect pulmonary edema versus multi lobar pneumonia. These results were called by telephone at the time of interpretation on 12/10/2015 at 1:11 pm to Dr. Merrily Pew , who verbally acknowledged these results. Electronically Signed   By: Kathreen Devoid   On:  12/01/2015 13:11     STUDIES:  -CT Head 11/21/2015 - acute/subacture right MCA infarct without hemorrhagic transformation, likely acute/subacute infarct in left cerebellum, edema throughout the right MCA territory.  -CXR 11/23/2015 >>>  CULTURES: -Blood Culture x 2 12/06/2015 >>> -Urine Culture 11/17/2015 >>>  ANTIBIOTICS: -Unasyn 11/15/2015>>>  SIGNIFICANT EVENTS:  LINES/TUBES: -OETT 11/23/2015>>>  DISCUSSION: 82 year old female admitted on 1/17 w/ large right MCA stroke. Intubated for AMS and acute hypoxia d/t probable aspiration. PCCM asked to admit. 3% NS to be initiated in ER. Stroke team following. Prognosis poor.   ASSESSMENT / PLAN:  PULMONARY A: Acute hypoxic Respiratory Failure in setting of probable aspiration PNA LUL atx (r/t ETT placement) P:   -Full Vent support  - ett repositioning  -PAD protocol  -Follow up ABG  -AM Chest Xray  CARDIOVASCULAR A:  Bradycardia (Reported Baseline HR 40s) Hypertension Initial Cardiac Enzymes Negative  P:  -Telemetry  -Echocardiogram - see neuro section re: BP goals   RENAL A:   Hyperkalemia  Acute Kidney Injury (Creatinine baseline 0.97; GFR  24) P:   -Kayexalate 15mg /9ml ordered  -Chemical Temporization with D5, insulin, and calcium gluconate  -Follow up BMP  GASTROINTESTINAL A:   Ventilator related dysphagia  P:   -NPO -Consult nutrition for tube feeds  -Protonix IV 40 mg   HEMATOLOGIC A:   H/o VIN III (Vulvar intraepithelial neoplasia III) P:  [place] heparin  Trend cbc Transfuse per protocol  INFECTIOUS A:   Aspiration PNA Leukocytosis (WBC 14.7) P:   -Urinalysis 11/19/2015 >>> -Blood Cultures 12/01/2015 >>> - empiric Unasyn (see above)  ENDOCRINE A:   Hypothyroidism  Hyperglycemia/Type 2 DM  P:   -Continue home dose synthroid -Q4 CBG, SSI -Hold home metformin   NEUROLOGIC A:   Right MCA acute infarct with cerebral edema  P:   -MRI/MRA of Brain/head >>> - 3% NS gtt  -Trend Sodium  Levels -Systolic goal greater than 180 - Titrate Cardene IV infusion to maintain goal   -Consult Neurology    RASS goal: -2   FAMILY  - Updates: pending   - Inter-disciplinary family meet or Palliative Care meeting due by:  12/07/2015  11/29/2015, 2:26 PM  Attending Note:  82 year old female with PMH of HTN and stage III vulvar cancer who was found by son covered in feces and unable to move the left side. Patient was last seen normal the night prior to admission. EMS was called, patient was hypoxemic and bradycardic. On CT that I reviewed myself large right MCA CVA.  Patient was intubated for airway protection.  Neurology was consulted and patient is out of the window of any intervention.  PCCM was called to admit.  Discussed with neuro, wish to start 3% saline.  Will place on 3% and BP control per neuro recommendations.  Needs family meeting to determine plan of care.  The patient is critically ill with multiple organ systems failure and requires high complexity decision making for assessment and support, frequent evaluation and titration of therapies, application of advanced monitoring technologies and extensive interpretation of multiple databases.   Critical Care Time devoted to patient care services described in this note is  35  Minutes. This time reflects time of care of this signee Dr Jennet Maduro. This critical care time does not reflect procedure time, or teaching time or supervisory time of PA/NP/Med student/Med Resident etc but could involve care discussion time.  Rush Farmer, M.D. [facility]. Pager: [phone]. After hours pager: [phone].

## 2015-11-30 NOTE — Progress Notes (Signed)
Patient transported on vent from ED to [id number] without complications.

## 2015-11-30 NOTE — ED Provider Notes (Signed)
CSN: [id number]     Arrival date & time 11/22/2015  1208 History   First MD Initiated Contact with Patient 12/10/2015 1235     Chief Complaint  Patient presents with  . Bradycardia  . Cerebrovascular Accident     (Consider location/radiation/quality/duration/timing/severity/associated sxs/prior Treatment) Patient is a 82 y.o. female presenting with altered mental status. The history is provided by the EMS personnel.  Altered Mental Status Presenting symptoms: confusion, disorientation and partial responsiveness   Severity:  Mild Most recent episode:  Today Duration:  2 days Timing:  Unable to specify Progression:  Unable to specify Chronicity:  New Context: not alcohol use and not dementia   Associated symptoms: no abdominal pain and no fever     Past Medical History  Diagnosis Date  . Hypothyroidism   . Type 2 diabetes mellitus ([place])   . VIN III (vulvar intraepithelial neoplasia III)   . Hypertension   . History of pneumonia     CAP  09-07-2015  . Otitis media of left ear     10-26-2015   Past Surgical History  Procedure Laterality Date  . Appendectomy  1952  . Abdominal hysterectomy  1972  . Shoulder arthroscopy Right 2000    impingement  . Cataract extraction w/ intraocular lens implant Left   . Vulvectomy N/A 11/09/2015    Procedure: WIDE  LOCAL EXCISION VULVA ;  Surgeon: Everitt Amber, MD;  Location: [facility];  Service: Gynecology;  Laterality: N/A;   Family History  Problem Relation Age of Onset  . Breast cancer Mother   . Heart disease Mother   . Heart attack Father 33  . Cancer Sister     ? cervical ca  . Cancer Sister    Social History  Substance Use Topics  . Smoking status: Never Smoker   . Smokeless tobacco: Never Used  . Alcohol Use: No   OB History    No data available     Review of Systems  Unable to perform ROS: Mental status change  Constitutional: Negative for fever.  Gastrointestinal: Negative for abdominal pain.   Psychiatric/Behavioral: Positive for confusion.      Allergies  Review of patient's allergies indicates no known allergies.  Home Medications   Prior to Admission medications   Medication Sig Start Date End Date Taking? Authorizing Provider  levothyroxine (SYNTHROID, LEVOTHROID) 125 MCG tablet Take 1 tablet (125 mcg total) by mouth daily before breakfast. 02/04/15  Yes Robyn Haber, MD  lisinopril (PRINIVIL,ZESTRIL) 20 MG tablet Take 20 mg by mouth daily.   Yes Historical Provider, MD  metFORMIN (GLUCOPHAGE) 1000 MG tablet Take 1,000 mg by mouth 2 (two) times daily with a meal.    Yes Historical Provider, MD  amLODipine (NORVASC) 10 MG tablet Take 1 tablet (10 mg total) by mouth daily. Patient not taking: Reported on 11/22/2015 05/14/15   Vivi Barrack, MD  amoxicillin (AMOXIL) 500 MG capsule Take 1 capsule (500 mg total) by mouth 3 (three) times daily. Patient not taking: Reported on 11/27/2015 10/26/15   Robyn Haber, MD  docusate sodium (COLACE) 100 MG capsule Take 1 capsule (100 mg total) by mouth 2 (two) times daily. Patient not taking: Reported on 12/08/2015 11/09/15   Everitt Amber, MD  oxyCODONE-acetaminophen (PERCOCET) 5-325 MG tablet Take 1-2 tablets by mouth every 4 (four) hours as needed for severe pain. Patient not taking: Reported on 12/01/2015 11/09/15   Everitt Amber, MD   BP 194/72 mmHg  Pulse 55  Temp(Src) 99.5  F (37.5 C) (Axillary)  Resp 24  Ht 5\' 3"  (1.6 m)  Wt 185 lb 6.5 oz (84.1 kg)  BMI 32.85 kg/m2  SpO2 100% Physical Exam  Constitutional: She appears well-developed and well-nourished.  HENT:  Head: Normocephalic and atraumatic.  Neck: Normal range of motion.  Cardiovascular: Regular rhythm.  Bradycardia present.   Pulmonary/Chest: Effort normal. No stridor. No respiratory distress.  Abdominal: Soft. She exhibits no distension. There is no tenderness.  Neurological:  Will not move left arm  Skin: Skin is warm and dry. No erythema.  Psychiatric: She has  a normal mood and affect.  Nursing note and vitals reviewed.   ED Course  Procedures (including critical care time)  CRITICAL CARE Performed by: Merrily Pew   Total critical care time: 60 minutes  Critical care time was exclusive of separately billable procedures and treating other patients.  Critical care was necessary to treat or prevent imminent or life-threatening deterioration.  Critical care was time spent personally by me on the following activities: development of treatment plan with patient and/or surrogate as well as nursing, discussions with consultants, evaluation of patient's response to treatment, examination of patient, obtaining history from patient or surrogate, ordering and performing treatments and interventions, ordering and review of laboratory studies, ordering and review of radiographic studies, pulse oximetry and re-evaluation of patient's condition.  INTUBATION Performed by: Merrily Pew  Required items: required blood products, implants, devices, and special equipment available Patient identity confirmed: provided demographic data and hospital-assigned identification number Time out: Immediately prior to procedure a "time out" was called to verify the correct patient, procedure, equipment, support staff and site/side marked as required.  Indications: Hypoxic respiratory failure, altered mental status: clinical course and workup/airway protection,   Intubation method: direct Laryngoscopy   Preoxygenation: nonrebreather  Sedatives: Etomidate Paralytic: Rocuronium  Tube Size: 7.5 cuffed  Post-procedure assessment: chest rise and ETCO2 monitor Breath sounds: equal and absent over the epigastrium Tube secured with: ETT holder Chest x-ray interpreted by radiologist and me.  Chest x-ray findings: endotracheal tube right mainstem, retracted 3 cm.  Patient tolerated the procedure well with no immediate complications.     Labs Review Labs Reviewed   MRSA PCR SCREENING - Abnormal; Notable for the following:    MRSA by PCR POSITIVE (*)    All other components within normal limits  COMPREHENSIVE METABOLIC PANEL - Abnormal; Notable for the following:    Potassium 5.7 (*)    Glucose, Bld 267 (*)    BUN 35 (*)    Creatinine, Ser 1.92 (*)    GFR calc non Af Amer 24 (*)    GFR calc Af Amer 27 (*)    All other components within normal limits  CBC WITH DIFFERENTIAL/PLATELET - Abnormal; Notable for the following:    WBC 14.7 (*)    RDW 17.5 (*)    Neutro Abs 12.5 (*)    All other components within normal limits  URINALYSIS, ROUTINE W REFLEX MICROSCOPIC (NOT AT [facility]) - Abnormal; Notable for the following:    Glucose, UA 250 (*)    Hgb urine dipstick TRACE (*)    Protein, ur 100 (*)    All other components within normal limits  URINE MICROSCOPIC-ADD ON - Abnormal; Notable for the following:    Squamous Epithelial / LPF 0-5 (*)    All other components within normal limits  COMPREHENSIVE METABOLIC PANEL - Abnormal; Notable for the following:    Potassium 5.9 (*)    CO2 16 (*)  Glucose, Bld 298 (*)    BUN 33 (*)    Creatinine, Ser 1.71 (*)    Total Bilirubin 1.3 (*)    GFR calc non Af Amer 27 (*)    GFR calc Af Amer 32 (*)    All other components within normal limits  HEMOGLOBIN A1C - Abnormal; Notable for the following:    Hgb A1c MFr Bld 8.3 (*)    All other components within normal limits  GLUCOSE, CAPILLARY - Abnormal; Notable for the following:    Glucose-Capillary 251 (*)    All other components within normal limits  GLUCOSE, CAPILLARY - Abnormal; Notable for the following:    Glucose-Capillary 210 (*)    All other components within normal limits  GLUCOSE, CAPILLARY - Abnormal; Notable for the following:    Glucose-Capillary 167 (*)    All other components within normal limits  GLUCOSE, CAPILLARY - Abnormal; Notable for the following:    Glucose-Capillary 124 (*)    All other components within normal limits  I-STAT CG4  LACTIC ACID, ED - Abnormal; Notable for the following:    Lactic Acid, Venous 2.79 (*)    All other components within normal limits  I-STAT ARTERIAL BLOOD GAS, ED - Abnormal; Notable for the following:    pH, Arterial 7.275 (*)    pCO2 arterial 45.6 (*)    pO2, Arterial 107.0 (*)    Acid-base deficit 6.0 (*)    All other components within normal limits  CBG MONITORING, ED - Abnormal; Notable for the following:    Glucose-Capillary 279 (*)    All other components within normal limits  CBG MONITORING, ED - Abnormal; Notable for the following:    Glucose-Capillary 252 (*)    All other components within normal limits  CULTURE, BLOOD (ROUTINE X 2)  URINE CULTURE  CULTURE, BLOOD (ROUTINE X 2)  TROPONIN I  PROTIME-INR  SODIUM  SODIUM  LIPID PANEL  SODIUM  HEMOGLOBIN A1C  SODIUM  SODIUM  I-STAT CG4 LACTIC ACID, ED  TYPE AND SCREEN  ABO/RH    Imaging Review Ct Head Wo Contrast  11/20/2015  CLINICAL DATA:  The patient is unable to move her left-side today shows discovered by her son and the head today. EXAM: CT HEAD WITHOUT CONTRAST TECHNIQUE: Contiguous axial images were obtained from the base of the skull through the vertex without intravenous contrast. COMPARISON:  None. FINDINGS: There is edema throughout the right MCA territory consistent with massive infarct. Low attenuation is also identified in the left cerebellar hemisphere. No hemorrhage, midline shift or mass is identified. Remote, small occipital infarct is noted. There is no hydrocephalus or pneumocephalus. The calvarium is intact. IMPRESSION: Findings consistent with massive acute or subacute right MCA infarct without hemorrhagic transformation. There is also likely an acute or subacute infarct in the left cerebellum. These results were called by telephone at the time of interpretation on 12/14/2015 at 1:20 pm to Dr. Merrily Pew , who verbally acknowledged these results. Electronically Signed   By: Inge Rise M.D.   On:  11/26/2015 13:21   Dg Chest Port 1 View  11/26/2015  CLINICAL DATA:  CVA, bradycardia, central line placement EXAM: PORTABLE CHEST 1 VIEW COMPARISON:  Prior film same day at 14:  49 FINDINGS: Cardiomediastinal silhouette is stable. Central mild vascular congestion without convincing pulmonary edema. There is patchy right infrahilar atelectasis or early infiltrate. Stable endotracheal and NG tube position. There is left IJ central line with tip in distal SVC. No pneumothorax.  IMPRESSION: Central vascular congestion without convincing pulmonary edema. Patchy right infrahilar atelectasis or early infiltrate. Stable endotracheal and NG tube position. There is left IJ central line in place. No pneumothorax. Electronically Signed   By: Lahoma Crocker M.D.   On: 11/26/2015 16:48   Dg Chest Port 1 View  12/07/2015  CLINICAL DATA:  Adjustment of endotracheal tube EXAM: PORTABLE CHEST 1 VIEW COMPARISON:  12/09/2015 at 12:51 p.m. FINDINGS: Endotracheal tube has been withdrawn. Tip now projects 1 cm above the Carina. Orogastric tube is stable and well positioned. Central vascular congestion and central interstitial and hazy airspace opacity in the lungs is unchanged from the earlier exam. IMPRESSION: 1. Endotracheal tube tip now projects 1 cm above the Carina. 2. Lung findings with central interstitial airspace opacities, likely due to pulmonary edema, are without change from the recent prior exam. Electronically Signed   By: Lajean Manes M.D.   On: 11/29/2015 14:58   Dg Chest Portable 1 View  11/29/2015  CLINICAL DATA:  Check ETT placement EXAM: PORTABLE CHEST 1 VIEW COMPARISON:  None. FINDINGS: Endotracheal tube with the tip in the right mainstem bronchus. Nasogastric tube coursing below the diaphragm. Bilateral interstitial and patchy alveolar airspace opacities. No pleural effusion or pneumothorax. Stable cardiomediastinal silhouette. No acute osseous abnormality. IMPRESSION: Endotracheal tube with the tip in the  right mainstem bronchus. Recommend retracting the endotracheal tube 6 cm. Bilateral interstitial and alveolar airspace opacities which may reflect pulmonary edema versus multi lobar pneumonia. These results were called by telephone at the time of interpretation on 12/03/2015 at 1:11 pm to Dr. Merrily Pew , who verbally acknowledged these results. Electronically Signed   By: Kathreen Devoid   On: 12/13/2015 13:11   I have personally reviewed and evaluated these images and lab results as part of my medical decision-making.   EKG Interpretation   Date/Time:  Tuesday November 30 2015 12:50:10 EST Ventricular Rate:  50 PR Interval:  115 QRS Duration: 91 QT Interval:  496 QTC Calculation: 452 R Axis:   80 Text Interpretation:  Sinus rhythm Borderline short PR interval Baseline  wander in lead(s) V4 ED PHYSICIAN INTERPRETATION AVAILABLE IN [facility] Confirmed by TEST, Record ([id number]) on 12/01/2015 7:33:55 AM      MDM   Final diagnoses:  Stroke-like symptoms  Stroke ([place])  History of ETT  Encounter for central line placement  bradycardia Hypoxic respiratory failure Middle Cerebral Artery infarction  82 yo F brought in for AMS, bradycardia/hypertension, left arm weakness, left sided neglect. On initial exam, same as above, initial ddx of CVA, ICH with cushings response but unknown last onset so not TPA candidate. HR down as low as mid 30's, responsive to atropine. Discussed HR and what appeared to be intermittent junctional rhythm with cardiology who agreed and thought it likely 2/2 neurologic issue rather than primary heart but would see patien in consultation if needed. CT with evidence of large L MCA, pateint with worsening mental status, hypoxia, poor clinical course expected so Intubated for hypoxia and airway protection. Neurology consulted and agreed with mca infarct, no TPA but maybe 3% NaCl, discussed with intensivist who will see patient for likely admission,.     Merrily Pew,  MD 12/01/15 [phone]

## 2015-11-30 NOTE — Code Documentation (Signed)
Pt intubated with a 7.5 et secured  23 at the teeth , good color change

## 2015-11-30 NOTE — ED Notes (Signed)
Pt here from home found by son lying in bed covered in feces unable to move left side . Pt sats 70 "s on room air and brady in the 20's at times

## 2015-12-01 ENCOUNTER — Inpatient Hospital Stay (HOSPITAL_COMMUNITY): Payer: Medicare Other

## 2015-12-01 ENCOUNTER — Ambulatory Visit: Payer: Medicare Other | Admitting: Gynecologic Oncology

## 2015-12-01 DIAGNOSIS — I63311 Cerebral infarction due to thrombosis of right middle cerebral artery: Secondary | ICD-10-CM

## 2015-12-01 DIAGNOSIS — G935 Compression of brain: Secondary | ICD-10-CM

## 2015-12-01 DIAGNOSIS — I6789 Other cerebrovascular disease: Secondary | ICD-10-CM

## 2015-12-01 LAB — GLUCOSE, CAPILLARY
GLUCOSE-CAPILLARY: 105 mg/dL — AB (ref 65–99)
Glucose-Capillary: 167 mg/dL — ABNORMAL HIGH (ref 65–99)
Glucose-Capillary: 124 mg/dL — ABNORMAL HIGH (ref 65–99)
Glucose-Capillary: 139 mg/dL — ABNORMAL HIGH (ref 65–99)
Glucose-Capillary: 198 mg/dL — ABNORMAL HIGH (ref 65–99)

## 2015-12-01 LAB — LIPID PANEL
CHOLESTEROL: 132 mg/dL (ref 0–200)
HDL: 50 mg/dL (ref >40)
LDL Cholesterol: 56 mg/dL (ref 0–99)
Total CHOL/HDL Ratio: 2.6 RATIO
Triglycerides: 128 mg/dL (ref <150)
VLDL: 26 mg/dL (ref 0–40)

## 2015-12-01 LAB — MRSA PCR SCREENING: MRSA BY PCR: POSITIVE — AB

## 2015-12-01 LAB — SODIUM
SODIUM: 139 mmol/L (ref 135–145)
SODIUM: 148 mmol/L — AB (ref 135–145)
Sodium: 141 mmol/L (ref 135–145)
Sodium: 147 mmol/L — ABNORMAL HIGH (ref 135–145)

## 2015-12-01 LAB — URINE CULTURE: CULTURE: NO GROWTH

## 2015-12-01 LAB — HEMOGLOBIN A1C
HEMOGLOBIN A1C: 8.3 % — AB (ref 4.8–5.6)
Mean Plasma Glucose: 192 mg/dL

## 2015-12-01 MED ORDER — CHLORHEXIDINE GLUCONATE CLOTH 2 % EX PADS
6.0000 | MEDICATED_PAD | Freq: Every day | CUTANEOUS | Status: DC
  Administered 2015-12-01 – 2015-12-02 (×2): 6 via TOPICAL

## 2015-12-01 MED ORDER — PRO-STAT SUGAR FREE PO LIQD
30.0000 mL | Freq: Two times a day (BID) | ORAL | Status: DC
  Administered 2015-12-01 – 2015-12-02 (×3): 30 mL
  Filled 2015-12-01 (×3): qty 30

## 2015-12-01 MED ORDER — FENTANYL CITRATE (PF) 100 MCG/2ML IJ SOLN
25.0000 ug | INTRAMUSCULAR | Status: DC | PRN
  Administered 2015-12-02: 100 ug via INTRAVENOUS
  Filled 2015-12-01: qty 2

## 2015-12-01 MED ORDER — SODIUM CHLORIDE 3 % IV SOLN
INTRAVENOUS | Status: DC
  Administered 2015-12-01: 75 mL/h via INTRAVENOUS
  Filled 2015-12-01 (×6): qty 500

## 2015-12-01 MED ORDER — ADULT MULTIVITAMIN W/MINERALS CH
1.0000 | ORAL_TABLET | Freq: Every day | ORAL | Status: DC
  Administered 2015-12-01 – 2015-12-02 (×2): 1
  Filled 2015-12-01 (×2): qty 1

## 2015-12-01 MED ORDER — SODIUM CHLORIDE 23.4 % INJECTION (4 MEQ/ML) FOR IV ADMINISTRATION
30.0000 mL | Freq: Once | INTRAVENOUS | Status: AC
  Administered 2015-12-01: 30 mL via INTRAVENOUS
  Filled 2015-12-01: qty 30

## 2015-12-01 MED ORDER — MIDAZOLAM HCL 2 MG/2ML IJ SOLN: 1.0000 mg | INTRAMUSCULAR | Status: DC | PRN

## 2015-12-01 MED ORDER — VITAL HIGH PROTEIN PO LIQD
1000.0000 mL | ORAL | Status: DC
  Administered 2015-12-01 (×3)
  Administered 2015-12-01: 1000 mL

## 2015-12-01 MED ORDER — SODIUM CHLORIDE 3 % IV SOLN
INTRAVENOUS | Status: DC
  Administered 2015-12-01: 25 mL/h via INTRAVENOUS
  Filled 2015-12-01 (×2): qty 500

## 2015-12-01 MED ORDER — MUPIROCIN 2 % EX OINT
1.0000 "application " | TOPICAL_OINTMENT | Freq: Two times a day (BID) | CUTANEOUS | Status: DC
  Administered 2015-12-01 (×3): 1 via NASAL
  Filled 2015-12-01 (×3): qty 22

## 2015-12-01 MED FILL — Medication: Qty: 1 | Status: AC

## 2015-12-01 NOTE — Progress Notes (Signed)
[facility] referral made. Referral number [id number], Spoke with Dennison Bulla.

## 2015-12-01 NOTE — Progress Notes (Signed)
Initial Nutrition Assessment  DOCUMENTATION CODES:   Obesity unspecified  INTERVENTION:   Initiate Vital High Protein @ 40 ml/hr via OG tube.   30 ml Prostat BID  MVI daily.    Tube feeding regimen provides 1160 kcal, 114 grams of protein, and 802 ml of H2O.    NUTRITION DIAGNOSIS:   Inadequate oral intake related to inability to eat as evidenced by NPO status.  GOAL:   Provide needs based on ASPEN/SCCM guidelines  MONITOR:   TF tolerance, Vent status, Labs, I & O's  REASON FOR ASSESSMENT:   Consult Enteral/tube feeding initiation and management  ASSESSMENT:   Pt admitted on 1/17 w/ large right MCA stroke. Intubated for AMS and acute hypoxia d/t probable aspiration. Poor prognosis noted.   Patient is currently intubated on ventilator support MV: 9.9 L/min Temp (24hrs), Avg:98.3 F (36.8 C), Min:97.2 F (36.2 C), Max:99.5 F (37.5 C)  No sedation.  Medications reviewed and include: 3% CBG's: 124-167 Labs reviewed: potassium elevated (5.9) Lab Results  Component Value Date   HGBA1C 8.3* 12/10/2015  Pt discussed during ICU rounds and with RN.  OG tube  Diet Order:  Diet NPO time specified  Skin:  Reviewed, no issues  Last BM:  1/17  Height:   Ht Readings from Last 1 Encounters:  12/01/15 5\' 3"  (1.6 m)   Weight:   Wt Readings from Last 1 Encounters:  12/01/15 185 lb 6.5 oz (84.1 kg)   Ideal Body Weight:     BMI:  Body mass index is 32.85 kg/(m^2).  Estimated Nutritional Needs:   Kcal:  [id number]  Protein:  >104 grams  Fluid:  > 1.5 L/day  EDUCATION NEEDS:   No education needs identified at this time  [place], [place], [place] Pager [phone] After Hours Pager

## 2015-12-01 NOTE — Progress Notes (Signed)
Upon 1600 assessment, noted that left pupil is now 5 mm and non reactive, whereas it has been 3-4 mm and briskly reactive on all previous assessments.  MD notified, MRI is scheduled for appx 7 pm tonight, MD is aware and agrees to wait for this time.  No new orders. Attempted to contact both sons with no success, messages left.  Will monitor closely.

## 2015-12-01 NOTE — Progress Notes (Addendum)
VASCULAR LAB PRELIMINARY  PRELIMINARY  PRELIMINARY  PRELIMINARY  Carotid duplex  completed.    Preliminary report:  Right:  Waveforms consistent with occluded distal internal carotid artery.  Left:  1-39% ICA stenosis.  Bilateral:  Unable to image vertebral arteries due to position and body habitus.  Breeann Reposa, RVT 12/01/2015, 3:56 PM

## 2015-12-01 NOTE — Progress Notes (Signed)
OT Cancellation Note  Patient Details Name: Marissa Ballard MRN: [id number] DOB: 20-Apr-1934   Cancelled Treatment:    Reason Eval/Treat Not Completed: Medical issues which prohibited therapy (intubated on cardene and 3)  Vonita Moss   OTR/L Pager: [phone] Office: [phone] .  12/01/2015, 11:09 AM

## 2015-12-01 NOTE — Progress Notes (Signed)
Called by bedside RN.  Patient's neurologic exam has changed.  Pupil on the left is non-reactive and dilating.  Patient is actively herniating.  They were to come in for a family meeting but never showed up.  I spoke with the older son who is in agreement about DNR status.  He would like his brother to participate in the decision.  Evidently they are both hard of hearing and can not communicate with each other over the phone.  I called the younger brother through his wife.  I informed them that her heart is going to stop and that we recommend DNR status.  After a long discussion, decision was made to make patient a full DNR with no further escalation of care.  The patient is critically ill with multiple organ systems failure and requires high complexity decision making for assessment and support, frequent evaluation and titration of therapies, application of advanced monitoring technologies and extensive interpretation of multiple databases.   Critical Care Time devoted to patient care services described in this note is  45  Minutes. This time reflects time of care of this signee Dr Jennet Maduro. This critical care time does not reflect procedure time, or teaching time or supervisory time of PA/NP/Med student/Med Resident etc but could involve care discussion time.  Rush Farmer, M.D. [facility] Pulmonary/Critical Care Medicine. Pager: [phone]. After hours pager: [phone].

## 2015-12-01 NOTE — Progress Notes (Signed)
PULMONARY / CRITICAL CARE MEDICINE   Name: Marissa Ballard MRN: [id number] DOB: [birth year]    ADMISSION DATE:  11/21/2015 CONSULTATION DATE: 12/04/2015  REFERRING MD:  Mesener   CHIEF COMPLAINT:  Acute Stroke   HISTORY OF PRESENT ILLNESS:   82 year old female admitted  presented to ED 11/24/2015 after son called EMS when patient was found lying on bed, altered w/ inability to move left side.. On EMS arrival found to be hypoxic (saturation 70s on room air), Bradycardiac (HR 20s).   Patient was intubated by ED attending shortly after arrival. PCCM to admit.   SUBJECTIVE:  Unresponsive.   VITAL SIGNS: BP 194/72 mmHg  Pulse 55  Temp(Src) 99.5 F (37.5 C) (Axillary)  Resp 24  Ht 5\' 3"  (1.6 m)  Wt 84.1 kg (185 lb 6.5 oz)  BMI 32.85 kg/m2  SpO2 100%  HEMODYNAMICS:    VENTILATOR SETTINGS: Vent Mode:  [-] PRVC FiO2 (%):  [60 %-100 %] 70 % Set Rate:  [16 bmp-24 bmp] 24 bmp Vt Set:  [400 mL] 400 mL PEEP:  [5 cmH20-10 cmH20] 10 cmH20 Plateau Pressure:  [20 cmH20-23 cmH20] 21 cmH20  INTAKE / OUTPUT: I/O last 3 completed shifts: In: 1370.9 [I.V.:1270.9; IV Piggyback:100] Out: 1000 [Urine:1000]  PHYSICAL EXAMINATION: General: Critically ill female. Now sedated on vent Neuro:  Withdraws to pain on right. HEENT:  ETT, pupils 39mm bilaterally and non-reactive,  Cardiovascular:  Regular rate and rhythm, no edema  Lungs:  Diffused scattered rhonchi, not using accessory muscles Abdomen: Flat, non-distended, OG tube in place  Musculoskeletal:  No visible joint deformities  Skin:  Cool and intact   LABS:  BMET  Recent Labs Lab 12/12/2015 1242 12/13/2015 1819 12/01/15 0300 12/01/15 0900  NA 137 137  136 139 141  K 5.7* 5.9*  --   --   CL 105 108  --   --   CO2 23 16*  --   --   BUN 35* 33*  --   --   CREATININE 1.92* 1.71*  --   --   GLUCOSE 267* 298*  --   --     Electrolytes  Recent Labs Lab 11/17/2015 1242 11/23/2015 1819  CALCIUM 9.4 9.0    CBC  Recent Labs Lab  11/29/2015 1242  WBC 14.7*  HGB 13.3  HCT 40.3  PLT 238    Coag's  Recent Labs Lab 12/12/2015 1242  INR 1.05    Sepsis Markers  Recent Labs Lab 11/14/2015 1254  LATICACIDVEN 2.79*   ABG  Recent Labs Lab 11/18/2015 1330  PHART 7.275*  PCO2ART 45.6*  PO2ART 107.0*   Liver Enzymes  Recent Labs Lab 11/29/2015 1242 12/01/2015 1819  AST 37 38  ALT 36 32  ALKPHOS 58 52  BILITOT 0.5 1.3*  ALBUMIN 3.9 3.5   Cardiac Enzymes  Recent Labs Lab 11/27/2015 1242  TROPONINI 0.03   Glucose  Recent Labs Lab 11/19/2015 1948 11/20/2015 2010 12/05/2015 2115 12/13/2015 2332 12/01/15 0337 12/01/15 0822  GLUCAP 279* 252* 251* 210* 167* 124*   Imaging Ct Head Wo Contrast  12/01/2015  CLINICAL DATA:  The patient is unable to move her left-side today shows discovered by her son and the head today. EXAM: CT HEAD WITHOUT CONTRAST TECHNIQUE: Contiguous axial images were obtained from the base of the skull through the vertex without intravenous contrast. COMPARISON:  None. FINDINGS: There is edema throughout the right MCA territory consistent with massive infarct. Low attenuation is also identified in the left cerebellar  hemisphere. No hemorrhage, midline shift or mass is identified. Remote, small occipital infarct is noted. There is no hydrocephalus or pneumocephalus. The calvarium is intact. IMPRESSION: Findings consistent with massive acute or subacute right MCA infarct without hemorrhagic transformation. There is also likely an acute or subacute infarct in the left cerebellum. These results were called by telephone at the time of interpretation on 11/27/2015 at 1:20 pm to Dr. Merrily Pew , who verbally acknowledged these results. Electronically Signed   By: Inge Rise M.D.   On: 12/12/2015 13:21   Dg Chest Port 1 View  11/17/2015  CLINICAL DATA:  CVA, bradycardia, central line placement EXAM: PORTABLE CHEST 1 VIEW COMPARISON:  Prior film same day at 14:  49 FINDINGS: Cardiomediastinal  silhouette is stable. Central mild vascular congestion without convincing pulmonary edema. There is patchy right infrahilar atelectasis or early infiltrate. Stable endotracheal and NG tube position. There is left IJ central line with tip in distal SVC. No pneumothorax. IMPRESSION: Central vascular congestion without convincing pulmonary edema. Patchy right infrahilar atelectasis or early infiltrate. Stable endotracheal and NG tube position. There is left IJ central line in place. No pneumothorax. Electronically Signed   By: Lahoma Crocker M.D.   On: 12/14/2015 16:48   Dg Chest Port 1 View  11/17/2015  CLINICAL DATA:  Adjustment of endotracheal tube EXAM: PORTABLE CHEST 1 VIEW COMPARISON:  11/24/2015 at 12:51 p.m. FINDINGS: Endotracheal tube has been withdrawn. Tip now projects 1 cm above the Carina. Orogastric tube is stable and well positioned. Central vascular congestion and central interstitial and hazy airspace opacity in the lungs is unchanged from the earlier exam. IMPRESSION: 1. Endotracheal tube tip now projects 1 cm above the Carina. 2. Lung findings with central interstitial airspace opacities, likely due to pulmonary edema, are without change from the recent prior exam. Electronically Signed   By: Lajean Manes M.D.   On: 11/27/2015 14:58   Dg Chest Portable 1 View  12/08/2015  CLINICAL DATA:  Check ETT placement EXAM: PORTABLE CHEST 1 VIEW COMPARISON:  None. FINDINGS: Endotracheal tube with the tip in the right mainstem bronchus. Nasogastric tube coursing below the diaphragm. Bilateral interstitial and patchy alveolar airspace opacities. No pleural effusion or pneumothorax. Stable cardiomediastinal silhouette. No acute osseous abnormality. IMPRESSION: Endotracheal tube with the tip in the right mainstem bronchus. Recommend retracting the endotracheal tube 6 cm. Bilateral interstitial and alveolar airspace opacities which may reflect pulmonary edema versus multi lobar pneumonia. These results were  called by telephone at the time of interpretation on 11/27/2015 at 1:11 pm to Dr. Merrily Pew , who verbally acknowledged these results. Electronically Signed   By: Kathreen Devoid   On: 11/28/2015 13:11   STUDIES:  -CT Head 11/15/2015 - acute/subacture right MCA infarct without hemorrhagic transformation, likely acute/subacute infarct in left cerebellum, edema throughout the right MCA territory.  -CXR 12/10/2015 >>>  CULTURES: -Blood Culture x 2 12/01/2015 >>> -Urine Culture 12/03/2015 >>>  ANTIBIOTICS: -Unasyn 11/29/2015>>>  SIGNIFICANT EVENTS:  LINES/TUBES: OETT 11/28/2015>>> R IJ TLC 1/17>>>  DISCUSSION: 82 year old female admitted on 1/17 w/ large right MCA stroke. Intubated for AMS and acute hypoxia d/t probable aspiration. PCCM asked to admit. 3% NS to be initiated in ER. Stroke team following. Prognosis poor.   ASSESSMENT / PLAN:  PULMONARY A: Acute hypoxic Respiratory Failure in setting of probable aspiration PNA LUL atx (r/t ETT placement) P:   - Begin PS trials, no extubation until mental status is addressed. - PAD protocol  - Follow  up ABG  - AM Chest Xray and ABG  CARDIOVASCULAR A:  Bradycardia (Reported Baseline HR 40s) Hypertension Initial Cardiac Enzymes Negative  P:  - Telemetry  - Echocardiogram pending - See neuro section re: BP goals  - Cardene if SBP>220.  RENAL A:   Hyperkalemia  Acute Kidney Injury (Creatinine baseline 0.97; GFR 24) Low UOP. P:   - Replace electrolytes as indicated. - BMET in AM.  GASTROINTESTINAL A:   Ventilator related dysphagia  P:   - Consult nutrition for tube feeds  - Protonix IV 40 mg   HEMATOLOGIC A:   H/o VIN III (Vulvar intraepithelial neoplasia III) P:  -  heparin  - Trend cbc - Transfuse per protocol  INFECTIOUS A:   Aspiration PNA Leukocytosis (WBC 14.7) P:   - Urinalysis 12/01/2015 >>>noted - Blood Cultures 12/09/2015 >>> - Empiric Unasyn (see above)  ENDOCRINE A:   Hypothyroidism   Hyperglycemia/Type 2 DM  P:   - Continue home dose synthroid - Q4 CBG, SSI - Hold home metformin  NEUROLOGIC A:   Right MCA acute infarct with cerebral edema  P:   - MRI/MRA of Brain/head >>> - 3% NS gtt  - Trend Sodium Levels - Systolic goal greater than 180 - Titrate Cardene IV infusion to maintain goal   - Neurology consulted appreciated.  RASS goal: -2  FAMILY  - Updates: family updated by neurology.   - Inter-disciplinary family meet or Palliative Care meeting due by:  12/07/2015  The patient is critically ill with multiple organ systems failure and requires high complexity decision making for assessment and support, frequent evaluation and titration of therapies, application of advanced monitoring technologies and extensive interpretation of multiple databases.   Critical Care Time devoted to patient care services described in this note is  35  Minutes. This time reflects time of care of this signee Dr Jennet Maduro. This critical care time does not reflect procedure time, or teaching time or supervisory time of PA/NP/Med student/Med Resident etc but could involve care discussion time.  Rush Farmer, M.D. [facility] Pulmonary/Critical Care Medicine. Pager: [phone]. After hours pager: [phone].

## 2015-12-01 NOTE — Progress Notes (Signed)
Echocardiogram 2D Echocardiogram has been performed.  Joelene Millin 12/01/2015, 11:09 AM

## 2015-12-01 NOTE — Progress Notes (Signed)
STROKE TEAM PROGRESS NOTE   HISTORY Marissa Ballard is an 82 y.o. female with a past medical history significant for HTN, DM type 2, vulvar cancer stage III, and hypothyroidism, brought in by EMS from home after being found by son lying in bed covered in feces unable to move left side. Patient was intubated on the ED, markedly hypertensive with SBP > 250. CT brain revealed a hyperdense right MCA sign with as well as a large acute to subacute right hemispheric infarct without hemorrhagic transformation or midline shift.There is also likely an acute or subacute infarct in the left cerebellum. She was last known well 11/28/2015, time unknown. Patient was not administered TPA secondary to delay in arrival. She was admitted to the neuro ICU for further evaluation and treatment.   SUBJECTIVE (INTERVAL HISTORY) Her son Gwyndolyn Saxon and daughter-in-law are at the bedside.  She is intubated on cardene and 3%. Comatose. Family in shock. Dr. Leonie Man asked about living will, life support. Son aware of her desires. There is another son who will be here later today. Oldest son is Marya Fossa. Pt's husband died 1 year ago.    OBJECTIVE Temp:  [97.2 F (36.2 C)-99.3 F (37.4 C)] 99.3 F (37.4 C) (01/18 0900) Pulse Rate:  [34-102] 55 (01/18 0900) Cardiac Rhythm:  [-] Sinus bradycardia (01/18 0900) Resp:  [14-32] 24 (01/18 0900) BP: (98-255)/(44-150) 187/77 mmHg (01/18 0900) SpO2:  [85 %-100 %] 100 % (01/18 0900) FiO2 (%):  [60 %-100 %] 70 % (01/18 0800) Weight:  [82.6 kg (182 lb 1.6 oz)-84.1 kg (185 lb 6.5 oz)] 84.1 kg (185 lb 6.5 oz) (01/18 0419)  CBC:   Recent Labs Lab 11/25/2015 1242  WBC 14.7*  NEUTROABS 12.5*  HGB 13.3  HCT 40.3  MCV 82.8  PLT 99991111    Basic Metabolic Panel:   Recent Labs Lab 12/01/2015 1242 11/20/2015 1819 12/01/15 0300  NA 137 137  136 139  K 5.7* 5.9*  --   CL 105 108  --   CO2 23 16*  --   GLUCOSE 267* 298*  --   BUN 35* 33*  --   CREATININE 1.92* 1.71*  --   CALCIUM  9.4 9.0  --     Lipid Panel:     Component Value Date/Time   CHOL 132 12/01/2015 0300   TRIG 128 12/01/2015 0300   HDL 50 12/01/2015 0300   CHOLHDL 2.6 12/01/2015 0300   VLDL 26 12/01/2015 0300   LDLCALC 56 12/01/2015 0300   HgbA1c:  Lab Results  Component Value Date   HGBA1C 8.3* 11/28/2015   Urine Drug Screen: No results found for: LABOPIA, COCAINSCRNUR, LABBENZ, AMPHETMU, THCU, LABBARB    IMAGING  Ct Head Wo Contrast 11/27/2015   Findings consistent with massive acute or subacute right MCA infarct without hemorrhagic transformation. There is also likely an acute or subacute infarct in the left cerebellum.   Dg Chest Port 1 View 11/26/2015   Central vascular congestion without convincing pulmonary edema. Patchy right infrahilar atelectasis or early infiltrate. Stable endotracheal and NG tube position. There is left IJ central line in place. No pneumothorax.  11/23/2015   1. Endotracheal tube tip now projects 1 cm above the Carina. 2. Lung findings with central interstitial airspace opacities, likely due to pulmonary edema, are without change from the recent prior exam.  12/14/2015   Endotracheal tube with the tip in the right mainstem bronchus. Recommend retracting the endotracheal tube 6 cm. Bilateral interstitial and alveolar airspace opacities  which may reflect pulmonary edema versus multi lobar pneumonia.    PHYSICAL EXAM Patient is intubated   . Afebrile. Head is nontraumatic. Neck is supple without bruit.    Cardiac exam no murmur or gallop. Lungs are clear to auscultation. Distal pulses are well felt. Neurological Exam :  comatose and unresponsive. Eyes are closed. Mild semipurposeful flexion withdrawal in the right upper extremity to sternal rub with trace withdrawal in the right leg. Posturing in the left upper extremity with extension the left upper extremity. Trace withdrawal in the left lower extremity to painful stimuli. Right pupil 5 mm fixed unreactive left pupil 3 mm  sluggishly reactive. Right corneal present left absent. Fundi were not visualized. Eyes and primary position. Increased tone on the left compared to the right. Both plantars upgoing.  ASSESSMENT/PLAN Marissa Ballard is a 82 y.o. female with history of HTN, DM type 2, vulvar cancer stage III, and hypothyroidism presenting with altered mental status, right gaze preference, left hemiparesis. She did not receive IV t-PA due to leg presentation.   Stroke:  Non-dominant large right MCA/ACA infarct in setting of R ICA occlusion as well as left cerebellar infarct. Felt to be embolic secondary to unknown source, possibly hypercoagulable state from vulvar cancer  Resultant  VDRF, global aphasia, dysphagia, left hemiparesis, comatose state  MRI  pending   MRA  pending   Carotid Doppler  pending   2D Echo  pending   EEG pending   LDL 56  HgbA1c 8.3  Heparin 5000 units sq tid for VTE prophylaxis Diet NPO time specified  No antithrombotic prior to admission, now on aspirin 300 mg suppository daily  Ongoing aggressive stroke risk factor management  Therapy recommendations:  pending   Disposition:  pending   Cerebral edema Induced hyponatremia  Started on 3% to decrease cerebral edema  Na 130 at 0300, drip at 25/hr  Will given 23.4% saline, then Increase 3% drip to 75h  D/c normal saline  Acute respiratory failure in setting of probable aspiration pneumonia  Intubated in the ED  Antibiotics Unasyn  On propofol  CCM following  Hypertensive emergency Severe sinus bradycardia  Systolic blood pressure > 250  Ordered Cardene but not started  Improved since admission  Permissive hypertension (OK if < 220/120) but gradually normalize in 5-7 days Had been oOn dopamine for HR - 20s at home, 30s here, now 49 with drip off  Diabetes type II, uncontrolled  HgbA1c 8.3, goal < 7.0  Glu 298  Other Stroke Risk Factors  Advanced age  Obesity, Body mass index is 32.85  kg/(m^2).   Other Active Problems  Valvular intraepithelial neoplasia stage III, surgery 11/09/2015  Acute kidney injury, Cr 1.71  Hyperkalemia, 5.9  Lactic acidosis 2.79  Dr. Leonie Man discussed diagnosis, prognosis,  treatment options and plan of care with in depth with son and wife. They are unsure - awaiting other brother. Dr. Leonie Man to return  When he arrives. Marland Kitchen   Hospital day # [place] for Pager information 12/01/2015 9:56 AM  I have personally examined this patient, reviewed notes, independently viewed imaging studies, participated in medical decision making and plan of care. I have made any additions or clarifications directly to the above note. Agree with note above. This patient has unfortunately presented with massive right hemispheric infarct involving the entire right carotid distribution with significant mass effect and midline shift but the poor neurological exam. She remains at significant risk  for death, neurological worsening and brain herniation. His prognosis remains quite poor. I had a long discussion with the patient's son and daughter-in-law at the bedside and explained his critical situation. Family wants to pursue aggressive care for now and await arrival of his other son to have further discussions about goals of care. Recommend 23.4% saline 30 ml  bolus followed by increased drip rate was 75 mL an hour and a.m. for sodium goal of 150-155 This patient is critically ill and at significant risk of neurological worsening, death and care requires constant monitoring of vital signs, hemodynamics,respiratory and cardiac monitoring, extensive review of multiple databases, frequent neurological assessment, discussion with family, other specialists and medical decision making of high complexity.I have made any additions or clarifications directly to the above note.This critical care time does not reflect procedure time, or teaching time or  supervisory time of PA/NP/Med Resident etc but could involve care discussion time.  I spent 60 minutes of neurocritical care time  in the care of  this patient.    Antony Contras, MD Medical Director [facility] Stroke Center Pager: [phone] 12/01/2015 5:13 PM    To contact Stroke Continuity provider, please refer to [email or web address] After hours, contact General Neurology

## 2015-12-01 NOTE — Progress Notes (Signed)
SLP Cancellation Note  Patient Details Name: LORILYN MONCAYO MRN: [id number] DOB: Jul 08, 1934   Cancelled treatment:       Reason Eval/Treat Not Completed: Medical issues which prohibited therapy (Intubated)  Gabriel Rainwater Sandy, CCC-SLP [phone]  Gabriel Rainwater Meryl 12/01/2015, 7:19 AM

## 2015-12-01 NOTE — Procedures (Signed)
History: 82 yo M s/p infarct, concern for seizures  Sedation: propofol  Technique: This is a 21 channel routine scalp EEG performed at the bedside with bipolar and monopolar montages arranged in accordance to the international 10/20 system of electrode placement. One channel was dedicated to EKG recording.    Background: The background is very asymetric with attenuation of faster frequencies on the right. There is some delta > theta slow activity seen on the right. On the left, there is generalized intermixed theta > delta activty with sleep strctures seen at times. These are asymmetric seen better on the right than left.   Photic stimulation: Physiologic driving is not performed  EEG Abnormalities: 1) Generalized slow activity 2) Attenuation of faster frequencies on the right.   Clinical Interpretation: This EEG is consistent with a focal right hemispheric dysfunction(consistent with the known infarct) in the setting of a more generalized non-specific cerebral dysfunction(encephalopathy). No evidence of seizure or definite seizure predisposition were seen on this study.   Roland Rack, MD [facility] [phone]  If 7pm- 7am, please page neurology on call as listed in [place].

## 2015-12-01 NOTE — Progress Notes (Signed)
Bedside EEG completed, results pending. 

## 2015-12-01 NOTE — Progress Notes (Signed)
PT Cancellation Note  Patient Details Name: TANAJIA BRAMLET MRN: [id number] DOB: 1934/04/04   Cancelled Treatment:    Reason Eval/Treat Not Completed: Patient not medically ready.  Pt currently on bedrest and family to discuss with MD plan for further care vs comfort for pt.  Will hold PT at this time and f/u as appropriate.     Elysa Womac, Thornton Papas 12/01/2015, 11:20 AM

## 2015-12-01 NOTE — Progress Notes (Signed)
Dr. Nicole Kindred called and notified of hypertonic saline order set place but no order to infuse 3%. Read Dr. Benetta Spar note to MD. Order to infuse 3% at 12mL/hr. Also verified that MRI can be done on dayshift per MRI tech's notes. MD confirmed.  Will continue to monitor.

## 2015-12-02 ENCOUNTER — Inpatient Hospital Stay (HOSPITAL_COMMUNITY): Payer: Medicare Other

## 2015-12-02 DIAGNOSIS — G9382 Brain death: Secondary | ICD-10-CM

## 2015-12-02 DIAGNOSIS — Z452 Encounter for adjustment and management of vascular access device: Secondary | ICD-10-CM | POA: Insufficient documentation

## 2015-12-02 DIAGNOSIS — G936 Cerebral edema: Secondary | ICD-10-CM | POA: Insufficient documentation

## 2015-12-02 LAB — BASIC METABOLIC PANEL
BUN: 23 mg/dL — ABNORMAL HIGH (ref 6–20)
CALCIUM: 8.8 mg/dL — AB (ref 8.9–10.3)
CO2: 20 mmol/L — ABNORMAL LOW (ref 22–32)
CREATININE: 1.3 mg/dL — AB (ref 0.44–1.00)
Chloride: >130 mmol/L (ref 101–111)
GFR, EST AFRICAN AMERICAN: 44 mL/min — AB (ref >60)
GFR, EST NON AFRICAN AMERICAN: 38 mL/min — AB (ref >60)
Glucose, Bld: 277 mg/dL — ABNORMAL HIGH (ref 65–99)
Potassium: 3.8 mmol/L (ref 3.5–5.1)
SODIUM: 161 mmol/L — AB (ref 135–145)

## 2015-12-02 LAB — CBC
HCT: 37.6 % (ref 36.0–46.0)
Hemoglobin: 12.3 g/dL (ref 12.0–15.0)
MCH: 27.3 pg (ref 26.0–34.0)
MCHC: 32.7 g/dL (ref 30.0–36.0)
MCV: 83.6 fL (ref 78.0–100.0)
PLATELETS: 202 10*3/uL (ref 150–400)
RBC: 4.5 MIL/uL (ref 3.87–5.11)
RDW: 18.5 % — AB (ref 11.5–15.5)
WBC: 10.1 10*3/uL (ref 4.0–10.5)

## 2015-12-02 LAB — GLUCOSE, CAPILLARY
GLUCOSE-CAPILLARY: 210 mg/dL — AB (ref 65–99)
GLUCOSE-CAPILLARY: 210 mg/dL — AB (ref 65–99)
Glucose-Capillary: 233 mg/dL — ABNORMAL HIGH (ref 65–99)

## 2015-12-02 LAB — PHOSPHORUS: PHOSPHORUS: 2.5 mg/dL (ref 2.5–4.6)

## 2015-12-02 LAB — MAGNESIUM: Magnesium: 1.9 mg/dL (ref 1.7–2.4)

## 2015-12-02 LAB — HEMOGLOBIN A1C
Hgb A1c MFr Bld: 8 % — ABNORMAL HIGH (ref 4.8–5.6)
MEAN PLASMA GLUCOSE: 183 mg/dL

## 2015-12-02 MED ORDER — MORPHINE SULFATE (PF) 2 MG/ML IV SOLN
2.0000 mg | INTRAVENOUS | Status: DC | PRN
  Administered 2015-12-02: 2 mg via INTRAVENOUS
  Filled 2015-12-02: qty 1

## 2015-12-02 MED ORDER — MIDAZOLAM HCL 5 MG/ML IJ SOLN
10.0000 mg/h | INTRAMUSCULAR | Status: DC
  Filled 2015-12-02: qty 10

## 2015-12-02 MED ORDER — MIDAZOLAM BOLUS VIA INFUSION
5.0000 mg | INTRAVENOUS | Status: DC | PRN
  Filled 2015-12-02: qty 20

## 2015-12-05 LAB — CULTURE, BLOOD (ROUTINE X 2): Culture: NO GROWTH

## 2015-12-06 ENCOUNTER — Ambulatory Visit: Payer: Medicare Other | Admitting: Gynecologic Oncology

## 2015-12-06 ENCOUNTER — Telehealth: Payer: Self-pay

## 2015-12-06 LAB — CULTURE, BLOOD (ROUTINE X 2): CULTURE: NO GROWTH

## 2015-12-06 NOTE — Telephone Encounter (Signed)
On 12/06/2015 I received a death certificate from [place] (Original). The death certificate is for cremation. The patient is a patient of Doctor Titus Mould. The death certificate will be taken to [facility] this pm for signature. On Dec 14, 2015 I received the death certificate back from Doctor Titus Mould. I got the death certificate ready and called the funeral home to let them know the death certificate is ready for pickup.

## 2015-12-15 NOTE — Progress Notes (Signed)
Pt without pupillary gag or cough reflexes this morning. She does not react to painful stimuli. She is not breathing over the ventilator currently. Above information called to Dr Nelda Marseille and Dr. Leonie Man. Order received to d/c dopamine.

## 2015-12-15 NOTE — Procedures (Signed)
Extubation Procedure Note  Patient Details:   Name: Marissa Ballard DOB: [birth year] MRN: [id number]   Airway Documentation:     Evaluation  O2 sats: transiently fell during during procedure Complications: No apparent complications Patient did tolerate procedure well. Bilateral Breath Sounds: Clear, Diminished Suctioning: Oral, Airway No   Pt was extubated using end of life withdrawal policy per Dr Titus Mould. Pt extubated to room air and ultimately expired.   Jesse Sans 12-20-15, 11:57 AM

## 2015-12-15 NOTE — Progress Notes (Signed)
SLP Cancellation Note  Patient Details Name: Marissa Ballard MRN: [id number] DOB: 19-May-1934   Cancelled treatment:       Reason Eval/Treat Not Completed: Medical issues which prohibited therapy. Signing off. Please re consult if needed.   [place], CCC-SLP [phone]    Gabriel Rainwater Meryl 12-22-2015, 8:43 AM

## 2015-12-15 NOTE — Progress Notes (Signed)
E-link MD notified of change in pt's HR from 40-60s to now sustaining 140s. Urine Output 2500cc for shift so far. MD aware. No new orders given. Will continue to monitor.

## 2015-12-15 NOTE — Progress Notes (Signed)
OT Cancellation Note/signing off  Patient Details Name: Marissa Ballard MRN: [id number] DOB: 1934-09-21   Cancelled Treatment:    Reason Eval/Treat Not Completed: Other (comment) Pt DNR. Signing off.  [place], OTR/L  [id number] 2015/12/08 12/08/2015, 8:13 AM

## 2015-12-15 NOTE — Progress Notes (Signed)
CRITICAL VALUE ALERT  Critical value received: NA 161  Date of notification:  12-10-2015  Time of notification:  0520  Critical value read back:Yes.    Nurse who received alert:  Velvet Bathe, RN  MD notified (1st page): Wallie Char, MD  Time of first page:  [phone]  MD notified (2nd page):  Time of second page:  Responding MD:  Wallie Char, MD  Time MD responded:  [phone]

## 2015-12-15 NOTE — Progress Notes (Signed)
STROKE TEAM PROGRESS NOTE   SUBJECTIVE (INTERVAL HISTORY) RN concerned with ongoing neuro decline. One son at the bedside. The other to arrive soon. Blood pressure in the 0000000 systolic range.Serum sodium 161 this am OBJECTIVE Temp:  [97.3 F (36.3 C)-100 F (37.8 C)] 98.2 F (36.8 C) (01/19 0724) Pulse Rate:  [42-134] 87 (01/19 0724) Cardiac Rhythm:  [-] Normal sinus rhythm (01/19 0600) Resp:  [19-43] 25 (01/19 0724) BP: (60-225)/(41-109) 71/42 mmHg (01/19 0724) SpO2:  [100 %] 100 % (01/19 0724) FiO2 (%):  [50 %-60 %] 50 % (01/19 0724) Weight:  [84.5 kg (186 lb 4.6 oz)] 84.5 kg (186 lb 4.6 oz) (01/19 0330)  CBC:   Recent Labs Lab 12/07/2015 1242 2015/12/15 0340  WBC 14.7* 10.1  NEUTROABS 12.5*  --   HGB 13.3 12.3  HCT 40.3 37.6  MCV 82.8 83.6  PLT 238 [id number]    Basic Metabolic Panel:   Recent Labs Lab 12/01/2015 1819  12/01/15 2100 12/15/15 0340  NA 137  136  < > 148* 161*  K 5.9*  --   --  3.8  CL 108  --   --  >130*  CO2 16*  --   --  20*  GLUCOSE 298*  --   --  277*  BUN 33*  --   --  23*  CREATININE 1.71*  --   --  1.30*  CALCIUM 9.0  --   --  8.8*  MG  --   --   --  1.9  PHOS  --   --   --  2.5  < > = values in this interval not displayed.  Lipid Panel:     Component Value Date/Time   CHOL 132 12/01/2015 0300   TRIG 128 12/01/2015 0300   HDL 50 12/01/2015 0300   CHOLHDL 2.6 12/01/2015 0300   VLDL 26 12/01/2015 0300   LDLCALC 56 12/01/2015 0300   HgbA1c:  Lab Results  Component Value Date   HGBA1C 8.0* 12/01/2015   Urine Drug Screen: No results found for: LABOPIA, COCAINSCRNUR, LABBENZ, AMPHETMU, THCU, LABBARB    IMAGING  Ct Head Wo Contrast 11/20/2015   Findings consistent with massive acute or subacute right MCA infarct without hemorrhagic transformation. There is also likely an acute or subacute infarct in the left cerebellum.   Dg Chest Port 1 View 12/13/2015   Central vascular congestion without convincing pulmonary edema. Patchy right  infrahilar atelectasis or early infiltrate. Stable endotracheal and NG tube position. There is left IJ central line in place. No pneumothorax.  11/22/2015   1. Endotracheal tube tip now projects 1 cm above the Carina. 2. Lung findings with central interstitial airspace opacities, likely due to pulmonary edema, are without change from the recent prior exam.  11/29/2015   Endotracheal tube with the tip in the right mainstem bronchus. Recommend retracting the endotracheal tube 6 cm. Bilateral interstitial and alveolar airspace opacities which may reflect pulmonary edema versus multi lobar pneumonia.   2D Echocardiogram  - Left ventricle: The cavity size was normal. Wall thickness wasnormal. Systolic function was moderately to severely reduced. Theestimated ejection fraction was in the range of 30% to 35%. Thereis akinesis of the mid-apicalanteroseptal and apical myocardium.Features are consistent with a pseudonormal left ventricularfilling pattern, with concomitant abnormal relaxation andincreased filling pressure (grade 2 diastolic dysfunction).Doppler parameters are consistent with high ventricular fillingpressure. - Mitral valve: Calcified annulus. There was mild regurgitation. - Pulmonary arteries: Systolic pressure was severely increased. PApeak pressure: 68 mm Hg (  S). - Impressions:  Akinesis of the mid/distal anteroseptal and apical walls withoverall moderate to severe LV dysfunction; grade 2 diastolicdysfunction with elevated LV filling pressure; mild MR and TR;severely elevated pulmonary pressure.  Carotid Doppler   Right: Waveforms consistent with occluded distal internal carotid artery. Left: 1-39% ICA stenosis. Bilateral: Unable to image vertebral arteries due to position and body habitus.  EEG 1) Generalized slow activity 2) Attenuation of faster frequencies on the right.    PHYSICAL EXAM Patient is intubated   . Afebrile. Head is nontraumatic. Neck is supple without bruit.     Cardiac exam no murmur or gallop. Lungs are clear to auscultation. Distal pulses are well felt. Neurological Exam :  comatose and unresponsive. Eyes are closed. Not following commands. Right pupil 5 mm fixed unreactive left pupil 4 mm fixed and unreactive. Bilateral corneals absent. Fundi were not visualized. Eyes and primary position. No extremity motor movements at rest or even with sternal rub or noxious stimuli. Not breathing above the ventilator. No  cough or gag response to suctioning. Both plantars upgoing.   ASSESSMENT/PLAN Ms. NEVEA SARTORIUS is a 82 y.o. female with history of HTN, DM type 2, vulvar cancer stage III, and hypothyroidism presenting with altered mental status, right gaze preference, left hemiparesis. She did not receive IV t-PA due to leg presentation.   Stroke:  Non-dominant large right MCA/ACA infarct in setting of R ICA occlusion as well as left cerebellar infarct. Felt to be embolic secondary to unknown source, possibly hypercoagulable state from vulvar cancer  Worsening this am with exam consistent with brain death, SBP 60s, no pupils, no gag  Pt is DNR, not yet comfort care  Resultant  VDRF, global aphasia, dysphagia, left hemiparesis, comatose state  Carotid Doppler  Right ICA occlusion  2D Echo  EF 30-35% with moderate to severe LV dysfunction  EEG generalized slow activity, faster on the right  LDL 56  HgbA1c 8.3  Heparin 5000 units sq tid for VTE prophylaxis Diet NPO time specified  No antithrombotic prior to admission, now on aspirin 300 mg suppository daily  Neuro worsening during the night with active herniation. Overall,poor neurologic prognosis. Critical care discussed in depth with signs. Both agreed to DO NOT RESUSCITATE status. [place] donor services notified.  Cannot perform apnea testing due to low BP. Anticipate withdrawal when family arrive.  Hypotensive  SBP < 60  Cerebral edema Induced hypernatremia  Started on 3% to  decrease cerebral edema  Na 161. Drip on hold  Acute respiratory failure in setting of probable aspiration pneumonia  Intubated in the ED  Antibiotics Unasyn  CCM following  Hypertensive emergency Severe sinus bradycardia transitioned to tachycardia  Systolic blood pressure > 250  Ordered Cardene but not started  Improved since admission  Permissive hypertension (OK if < 220/120) but gradually normalize in 5-7 days Had been oOn dopamine for HR - 20s at home, 30s here HR up to 140s during the night, now normalized  Diabetes type II, uncontrolled  HgbA1c 8.3, goal < 7.0  Glu 298  Other Stroke Risk Factors  Advanced age  Obesity, Body mass index is 33.01 kg/(m^2).   Other Active Problems  Valvular intraepithelial neoplasia stage III, surgery 11/09/2015  Acute kidney injury, Cr 1.71  Hyperkalemia, 5.9  Lactic acidosis 2.79  Dr. Leonie Man to meet with family when they arrive    Hospital day # [place] for Pager information 09-Dec-2015 8:06 AM  I have  personally examined this patient, reviewed notes, independently viewed imaging studies, participated in medical decision making and plan of care. I have made any additions or clarifications directly to the above note. Agree with note above. I had a long discussion with the patient's son and daughter-in-law regarding her progressive neurological decline and present neurological exam being consistent with brain death. Family is awaiting arrival of the other son before ventilatory support is withdrawn. This patient is critically ill and at significant risk of neurological worsening, death and care requires constant monitoring of vital signs, hemodynamics,respiratory and cardiac monitoring, extensive review of multiple databases, frequent neurological assessment, discussion with family, other specialists and medical decision making of high complexity.I have made any additions or  clarifications directly to the above note.This critical care time does not reflect procedure time, or teaching time or supervisory time of PA/NP/Med Resident etc but could involve care discussion time.  I spent 50 minutes of neurocritical care time  in the care of  this patient.     Antony Contras, MD Medical Director [facility] Pager: [phone] 2015-12-09 2:31 PM    To contact Stroke Continuity provider, please refer to [email or web address] After hours, contact General Neurology

## 2015-12-15 NOTE — Discharge Summary (Signed)
Patient ID: Marissa Ballard MRN: [id number] DOB/AGE: Feb 15, 1934 82 y.o.  Admit date: 2015-12-17 Death date: Dec 19, 2015 1205 pm  Admission Diagnoses: Stroke  Cause of Death:  Brain Death secondary to brain herniation and respiratory failure due to malignant cerebral edema due to large right hemispheric infarct due to right internal carotid artery occlusion. Patient made DO NOT RESUSCITATE and comfort care Pertinent Medical Diagnosis: Active Problems:   Acute CVA (cerebrovascular accident) ([place]) Malignant cerebral edema Brain herniation   Acute respiratory failure with hypoxemia (HCC)   Hypertension, essential Vulval cancer  Hospital Course:  Marissa Ballard is an 82 y.o. female with a past medical history significant for HTN, DM type 2, vulvar cancer stage III, and hypothyroidism, brought in by EMS from home after being found by son lying in bed covered in feces unable to move left side. Patient was intubated on the ED, markedly hypertensive with SBP > 250. Currently, she is intubated on the vent, sedated with propofol. CT brain revealed a hyperdense right MCA sign with as well as a large acute to subacute right hemispheric infarct without hemorrhagic transformation or midline shift.There is also likely an acute or subacute infarct in the left cerebellum. Date last known well: presumably 2 days ago Time last known well: unable to determine tPA Given: no, late presentation Patient neurological condition remained quite poor since admission and she was comatose and unresponsive with only semipurposeful movements on the right and posturing on the left. She was started on hypertonic saline but neurological condition continued to decline. After discussion with the patient's family she was initially made DO NOT RESUSCITATE but next day morning on rounds she was found to have fixed unreactive pupils with absent corneal reflex and cough and gag and no motor response to painful stimuli. Patient's exam was  consistent with brain death but apnea test could not be performed due to her blood pressure mean 0000000 systolic. After discussion with the family it was agreed to withdraw ventilatory support and patient passed away shortly thereafter and was pronounced dead by the RN. IMAGING  Ct Head Wo Contrast Dec 17, 2015 Findings consistent with massive acute or subacute right MCA infarct without hemorrhagic transformation. There is also likely an acute or subacute infarct in the left cerebellum.   Dg Chest Port 1 View 12/17/2015 Central vascular congestion without convincing pulmonary edema. Patchy right infrahilar atelectasis or early infiltrate. Stable endotracheal and NG tube position. There is left IJ central line in place. No pneumothorax.  12/17/15 1. Endotracheal tube tip now projects 1 cm above the Carina. 2. Lung findings with central interstitial airspace opacities, likely due to pulmonary edema, are without change from the recent prior exam.  December 17, 2015 Endotracheal tube with the tip in the right mainstem bronchus. Recommend retracting the endotracheal tube 6 cm. Bilateral interstitial and alveolar airspace opacities which may reflect pulmonary edema versus multi lobar pneumonia.   2D Echocardiogram  - Left ventricle: The cavity size was normal. Wall thickness wasnormal. Systolic function was moderately to severely reduced. Theestimated ejection fraction was in the range of 30% to 35%. Thereis akinesis of the mid-apicalanteroseptal and apical myocardium.Features are consistent with a pseudonormal left ventricularfilling pattern, with concomitant abnormal relaxation andincreased filling pressure (grade 2 diastolic dysfunction).Doppler parameters are consistent with high ventricular fillingpressure. - Mitral valve: Calcified annulus. There was mild regurgitation. - Pulmonary arteries: Systolic pressure was severely increased. PApeak pressure: 68 mm Hg (S). - Impressions: Akinesis of the  mid/distal anteroseptal and apical walls withoverall moderate to severe LV  dysfunction; grade 2 diastolicdysfunction with elevated LV filling pressure; mild MR and TR;severely elevated pulmonary pressure.  Carotid Doppler  Right: Waveforms consistent with occluded distal internal carotid artery. Left: 1-39% ICA stenosis. Bilateral: Unable to image vertebral arteries due to position and body habitus.  EEG 1) Generalized slow activity 2) Attenuation of faster frequencies on the right Signed: Quinnlyn Hearns 12/26/15, 2:33 PM

## 2015-12-15 NOTE — Progress Notes (Addendum)
PULMONARY / CRITICAL CARE MEDICINE   Name: Marissa Ballard MRN: [id number] DOB: 1934-08-06    ADMISSION DATE:  11/25/2015 CONSULTATION DATE: 11/19/2015  REFERRING MD:  [place]   CHIEF COMPLAINT:  Acute Stroke   HISTORY OF PRESENT ILLNESS:   82 year old female admitted  presented to ED 11/26/2015 after son called EMS when patient was found lying on bed, altered w/ inability to move left side.. On EMS arrival found to be hypoxic (saturation 70s on room air), Bradycardiac (HR 20s).   Patient was intubated by ED attending shortly after arrival. PCCM to admit.   SUBJECTIVE:  Unresponsive, dnr established  VITAL SIGNS: BP 71/42 mmHg  Pulse 87  Temp(Src) 98.2 F (36.8 C) (Core (Comment))  Resp 25  Ht 5\' 3"  (1.6 m)  Wt 84.5 kg (186 lb 4.6 oz)  BMI 33.01 kg/m2  SpO2 100%  HEMODYNAMICS:    VENTILATOR SETTINGS: Vent Mode:  [-] PRVC FiO2 (%):  [50 %-60 %] 50 % Set Rate:  [24 bmp] 24 bmp Vt Set:  [400 mL] 400 mL PEEP:  [8 cmH20] 8 cmH20 Plateau Pressure:  [16 cmH20-20 cmH20] 16 cmH20  INTAKE / OUTPUT: I/O last 3 completed shifts: In: 3701.1 [I.V.:2915.3; NG/GT:585.8; IV Piggyback:200] Out: 4545 [Urine:4545]  PHYSICAL EXAMINATION: General: Critically ill female, unresponsive Neuro: nonreactive, no gag, no cough, no corneals, no response to pain, pupils fixed dilated HEENT:  ETT, pupils unreactive Cardiovascular:  Regular rate and rhythm, no edema  Lungs:  Diffused scattered rhonchi Abdomen: Flat, non-distended, OG tube in place  Musculoskeletal:  No visible joint deformities  Skin:  Cool and intact   LABS:  BMET  Recent Labs Lab 11/26/2015 1242 12/05/2015 1819  12/01/15 1445 12/01/15 2100 12-27-15 0340  NA 137 137  136  < > 147* 148* 161*  K 5.7* 5.9*  --   --   --  3.8  CL 105 108  --   --   --  >130*  CO2 23 16*  --   --   --  20*  BUN 35* 33*  --   --   --  23*  CREATININE 1.92* 1.71*  --   --   --  1.30*  GLUCOSE 267* 298*  --   --   --  277*  < > = values in  this interval not displayed.  Electrolytes  Recent Labs Lab 11/18/2015 1242 11/14/2015 1819 12-27-15 0340  CALCIUM 9.4 9.0 8.8*  MG  --   --  1.9  PHOS  --   --  2.5    CBC  Recent Labs Lab 12/01/2015 1242 2015/12/27 0340  WBC 14.7* 10.1  HGB 13.3 12.3  HCT 40.3 37.6  PLT 238 202    Coag's  Recent Labs Lab 11/21/2015 1242  INR 1.05    Sepsis Markers  Recent Labs Lab 12/01/2015 1254  LATICACIDVEN 2.79*   ABG  Recent Labs Lab 12/01/2015 1330  PHART 7.275*  PCO2ART 45.6*  PO2ART 107.0*   Liver Enzymes  Recent Labs Lab 11/27/2015 1242 11/25/2015 1819  AST 37 38  ALT 36 32  ALKPHOS 58 52  BILITOT 0.5 1.3*  ALBUMIN 3.9 3.5   Cardiac Enzymes  Recent Labs Lab 12/14/2015 1242  TROPONINI 0.03   Glucose  Recent Labs Lab 12/01/15 1149 12/01/15 1538 12/01/15 1945 12/01/15 2335 27-Dec-2015 0331 2015-12-27 0822  GLUCAP 139* 105* 198* 210* 233* 210*   Imaging No results found. STUDIES:  -CT Head 12/01/2015 - acute/subacture right MCA  infarct without hemorrhagic transformation, likely acute/subacute infarct in left cerebellum, edema throughout the right MCA territory.  -CXR 11/29/2015 >>> EEG 1/18>>>This EEG is consistent with a focal right hemispheric dysfunction(consistent with the known infarct) in the setting of a more generalized non-specific cerebral dysfunction(encephalopathy). No evidence of seizure  Echo 1/18>>>Akinesis of the mid/distal anteroseptal and apical walls with overall moderate to severe LV dysfunction; grade 2 diastolic dysfunction with elevated LV filling pressure; mild MR and TR; severely elevated pulmonary pressure.  CULTURES: -Blood Culture x 2 11/24/2015 >>> -Urine Culture 11/26/2015 >>>  ANTIBIOTICS: -Unasyn 11/28/2015>>>  SIGNIFICANT EVENTS: 1/18- blown pupils, concern herniation LINES/TUBES: OETT 11/19/2015>>> R IJ TLC 1/17>>>  DISCUSSION: 82 year old female admitted on 1/17 w/ large right MCA stroke. Intubated for AMS and acute  hypoxia d/t probable aspiration. PCCM asked to admit. 3% NS to be initiated in ER. Stroke team following. Prognosis poor.   ASSESSMENT / PLAN:  PULMONARY A: Acute hypoxic Respiratory Failure in setting of probable aspiration PNA LUL atx (r/t ETT placement) P:   - pcxr concern edema, may limit 3% use if vent needs worsen -no wean if think advancing to brain death -last ABG reviewed, keep some what elevated MV  CARDIOVASCULAR A:  Bradycardia (Reported Baseline HR 40s) Hypertension Initial Cardiac Enzymes Negative  PULM HTN LV dysfxn P:  - Telemetry  - Echocardiogram reviewed - Cardene if SBP>220, may need to control further with echo findings failure, we are doing permissive overall  RENAL A:   Hyperkalemia  Acute Kidney Injury (Creatinine baseline 0.97; GFR 24) Low UOP. P:   - 3% per neuro, unlikley to change outcome -if pulm edema increase, would need to dc 3% -Na range goal 155  GASTROINTESTINAL A:   Ventilator related dysphagia  P:   - TF, if tolerated - Protonix IV 40 mg   HEMATOLOGIC A:   H/o VIN III (Vulvar intraepithelial neoplasia III) P:  - [place] heparin  - Trend cbc - Transfuse per protocol  INFECTIOUS A:   Aspiration PNA Leukocytosis (WBC 14.7) P:   - Urinalysis 11/22/2015 >>>noted - Blood Cultures 11/26/2015 >>> - Empiric Unasyn (see above) - maintain  ENDOCRINE A:   Hypothyroidism  Hyperglycemia/Type 2 DM  P:   - Continue home dose synthroid - Q4 CBG, SSI - Hold home metformin  NEUROLOGIC A:   Right MCA acute infarct with cerebral edema, brain death examination P:   - MRI/MRA , consider to NOT do this - 3% NS gtt , consider dc - Trend Sodium Levels, goal [id number] - Systolic goal greater than 180 - Titrate Cardene IV infusion to maintain goal   - Neurology -BP low, no Apnea , will discuss with family not , comfort vs ancillary TCD then make brain death dz  RASS goal: 0  Ccm time 30 min    Lavon Paganini. Titus Mould, [place] Pgr:  [facility]   Extensive discussion with family med poa son. We discussed the poor prognosis and likely poor quality of life. Family has decided to offer full comfort care. They are aware that the patient may be transferred to palliative care floor for continued comfort care needs. They have been fully updated on the process and expectations. They do not want any further testing  Lavon Paganini. Titus Mould, MD, [place] Pgr: [facility]

## 2015-12-15 NOTE — Progress Notes (Signed)
PT Cancellation Note  Patient Details Name: DEAVION OLIVEROS MRN: [id number] DOB: 1934/06/09   Cancelled Treatment:    Reason Eval/Treat Not Completed: PT screened, no needs identified, will sign off.  Pt with continued neurological decline.  Will sign off PT at this time.     Mamadou Breon, [place] Papas 12/31/15, 8:14 AM

## 2015-12-15 NOTE — Progress Notes (Signed)
At 12:05 pt has expired. There are no respirations, no pulse, no electrical activity. No heart tones nor breath sounds audible. Above confirmed with Buck Mam RN.

## 2015-12-15 DEATH — deceased

## 2015-12-21 ENCOUNTER — Encounter (HOSPITAL_COMMUNITY): Payer: Self-pay

## 2015-12-21 ENCOUNTER — Inpatient Hospital Stay (HOSPITAL_COMMUNITY): Admit: 2015-12-21 | Payer: Medicare Other | Admitting: Gynecologic Oncology

## 2015-12-21 SURGERY — VULVECTOMY, RADICAL
Anesthesia: General | Laterality: Bilateral

## 2016-03-08 ENCOUNTER — Other Ambulatory Visit: Payer: Self-pay | Admitting: Family Medicine

## 2016-09-23 IMAGING — CT CT ABD-PELV W/O CM
2 of 4 series · 16 of 46 positions shown, 18 images · non-contrast
Comparison: None

CLINICAL DATA: Abdominal pain.  Nausea vomiting and diarrhea.

EXAM:
CT ABDOMEN AND PELVIS WITHOUT CONTRAST
TECHNIQUE: Multidetector CT imaging of the abdomen and pelvis was performed
following the standard protocol without IV contrast.

[Series 2: abd/ pelvis 5.0 i30f 1 · axial · 0.84mm/px · z∈[-494,-99]mm · 13 of 87 slices shown, 15 images]
[im 4/87  soft-tissue]
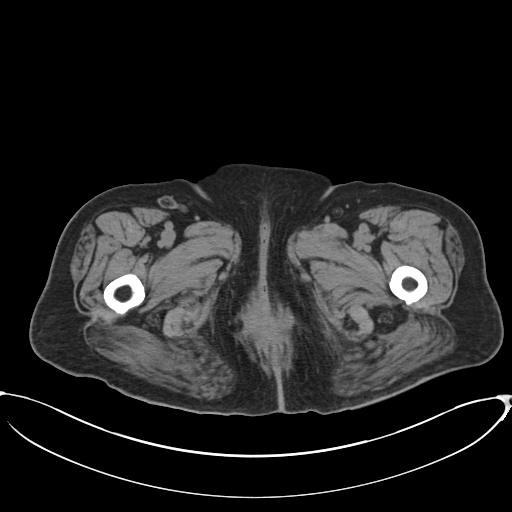
[im 4/87  bone]
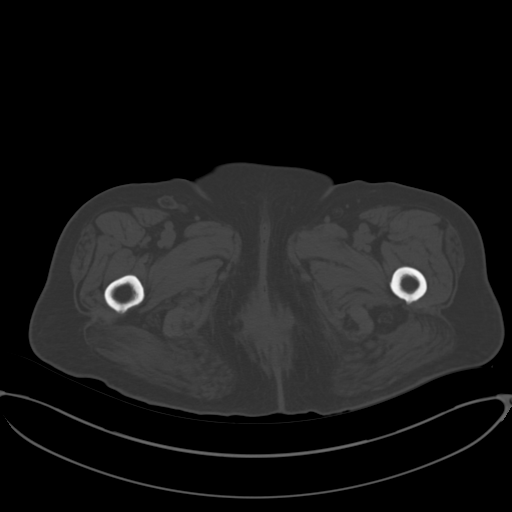
[im 10/87  soft-tissue]
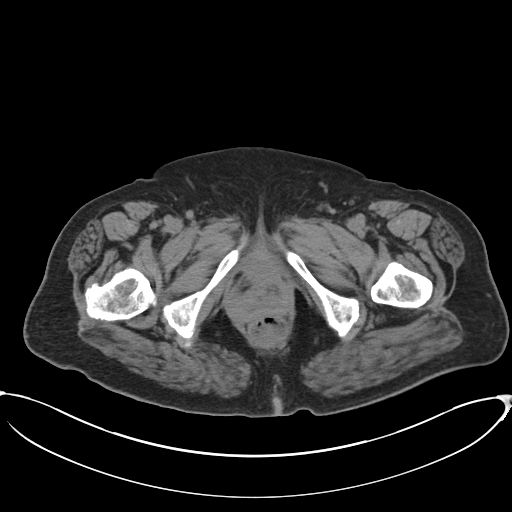
[im 17/87  soft-tissue]
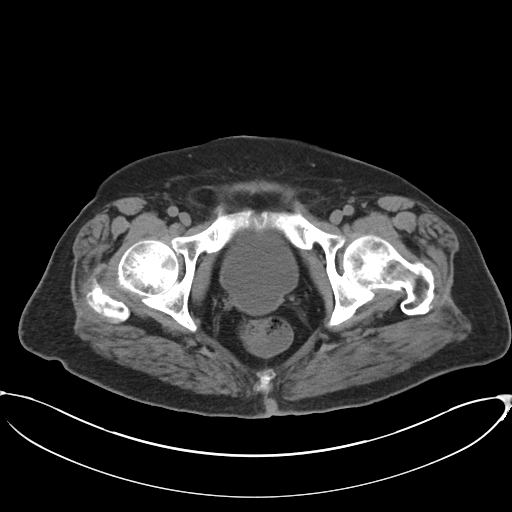
[im 24/87  soft-tissue]
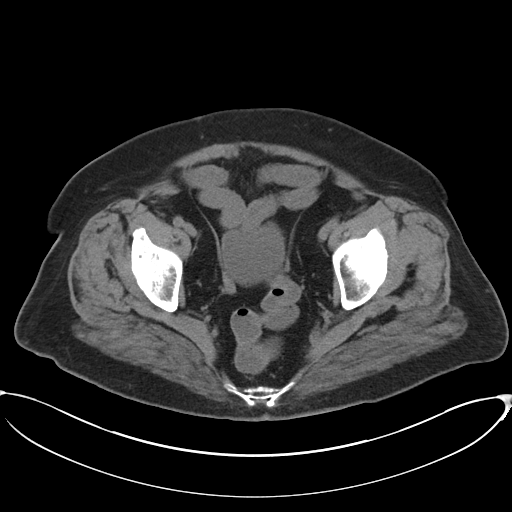
[im 30/87  soft-tissue]
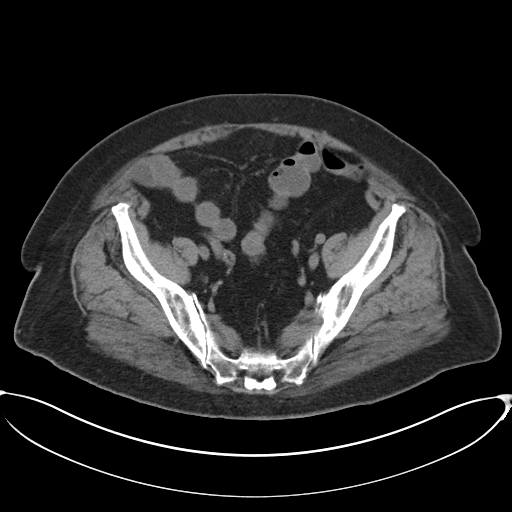
[im 37/87  soft-tissue]
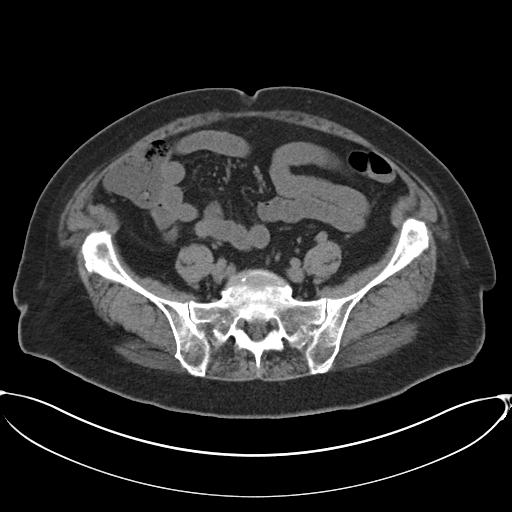
[im 44/87  soft-tissue]
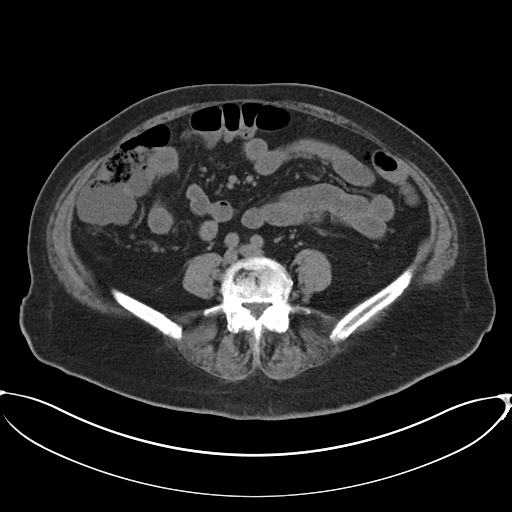
[im 50/87  soft-tissue]
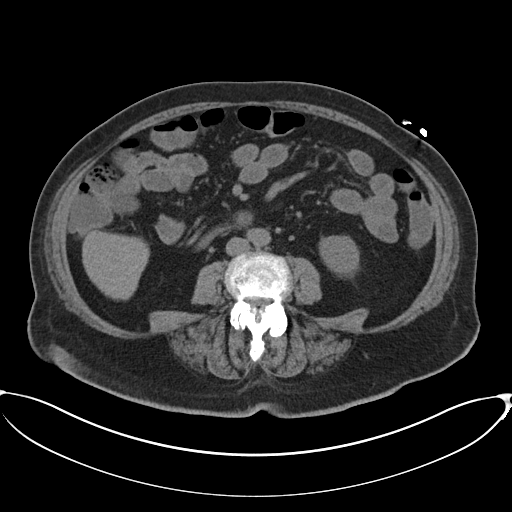
[im 57/87  soft-tissue]
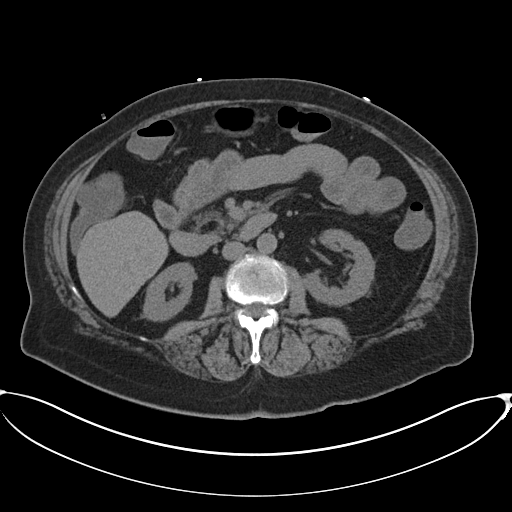
[im 57/87  bone]
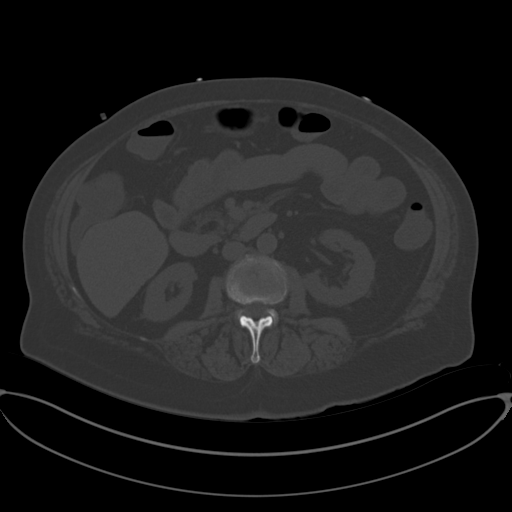
[im 63/87  soft-tissue]
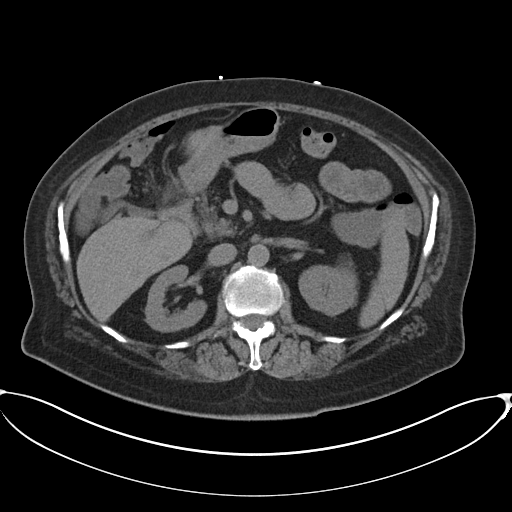
[im 70/87  soft-tissue]
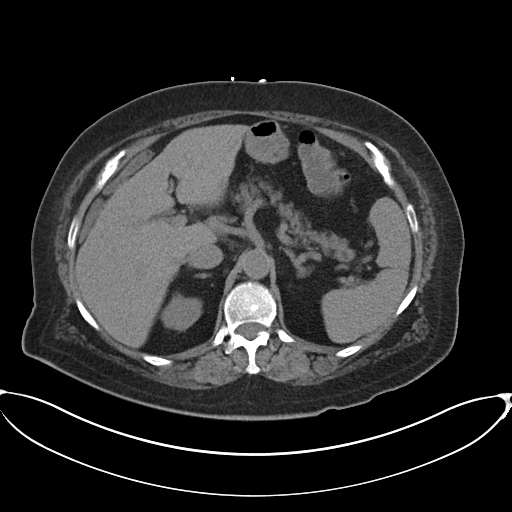
[im 77/87  soft-tissue]
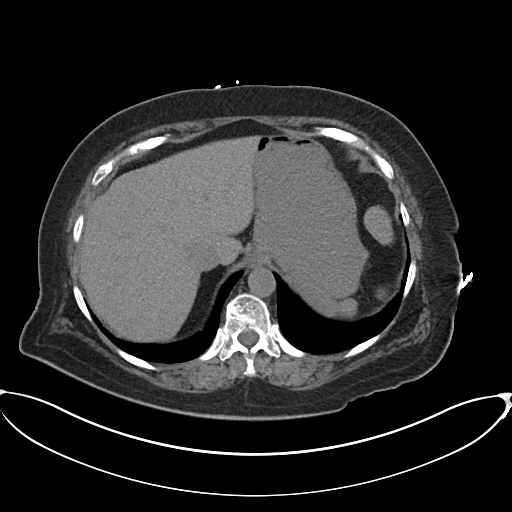
[im 83/87  soft-tissue]
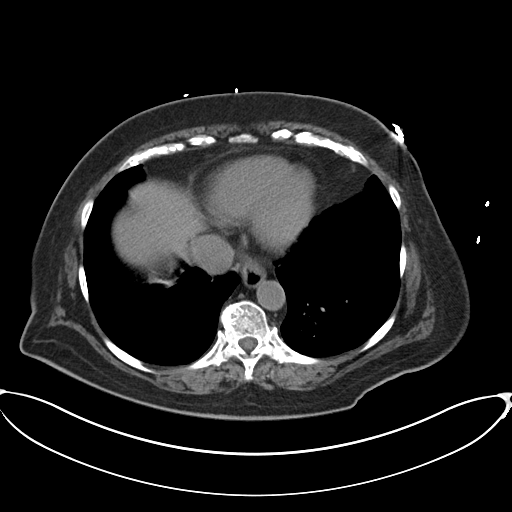

[Series 5: coronals · coronal · 0.80mm/px · 3 of 133 slices shown]
[im 45/133  soft-tissue]
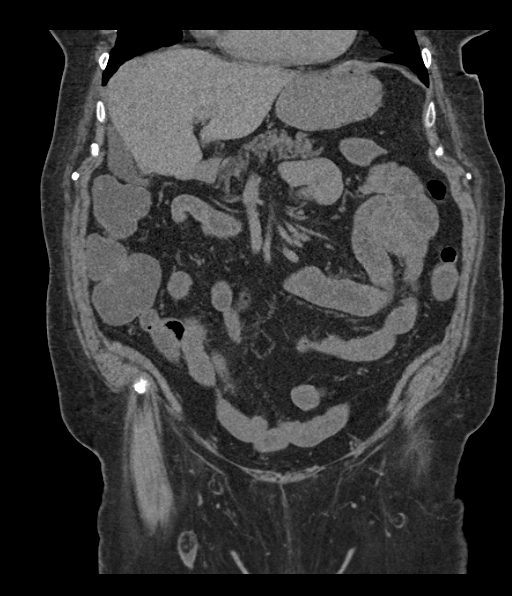
[im 59/133  soft-tissue]
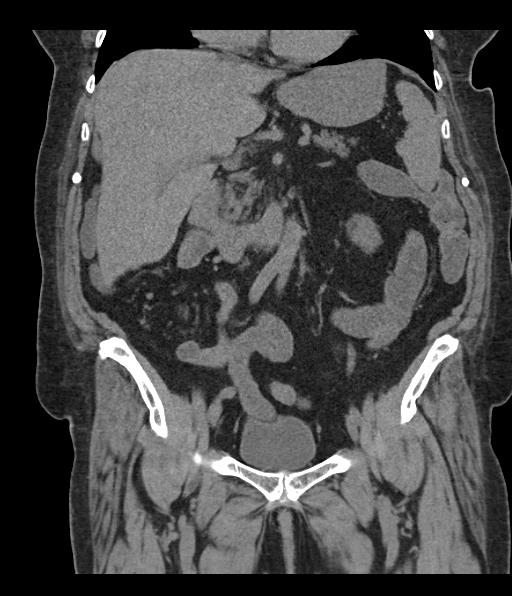
[im 74/133  soft-tissue]
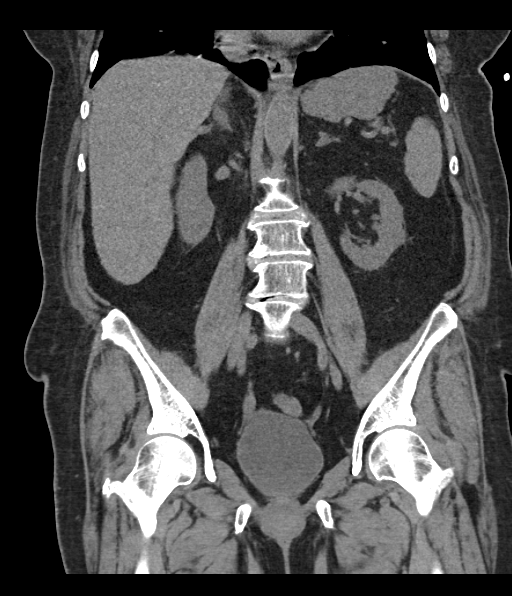

[16 of 46 positions shown; findings below may reference images not displayed]

FINDINGS: Lower chest: The lung bases are clear. No pleural or pericardial
effusion.

Hepatobiliary: There is no suspicious liver abnormality. The
gallbladder is within normal limits. No biliary dilatation.

Pancreas: Negative

Spleen: Normal appearance of the spleen.

Adrenals/Urinary Tract: The adrenal glands are both normal. The
right kidney is normal. Normal appearance of the left kidney. The
urinary bladder is within normal limits.

Stomach/Bowel: The stomach is within normal limits. The small bowel
loops have a normal course and caliber. No obstruction. Normal
appearance of the colon.

Vascular/Lymphatic: Calcified atherosclerotic disease involves the
abdominal aorta. No aneurysm. No enlarged retroperitoneal or
mesenteric adenopathy. No enlarged pelvic or inguinal lymph nodes.

Reproductive: Previous hysterectomy.  No adnexal mass.

Other: There is no ascites or focal fluid collections within the
abdomen or pelvis.

Musculoskeletal: Degenerative disc disease is identified within the
lumbar spine. Most advanced at L3-4 and L4-5. Bilateral facet
hypertrophy and degenerative change noted.
IMPRESSION: 1. No acute findings within the abdomen or pelvis.
2. Aortic atherosclerosis.
3. Degenerative disc disease.

## 2017-01-18 IMAGING — CR DG CHEST 2V
2 series · 2 of 2 positions shown · non-contrast
Comparison: None.

CLINICAL DATA: Cough, nasal congestion, sore throat

EXAM:
CHEST  2 VIEW

[PA]
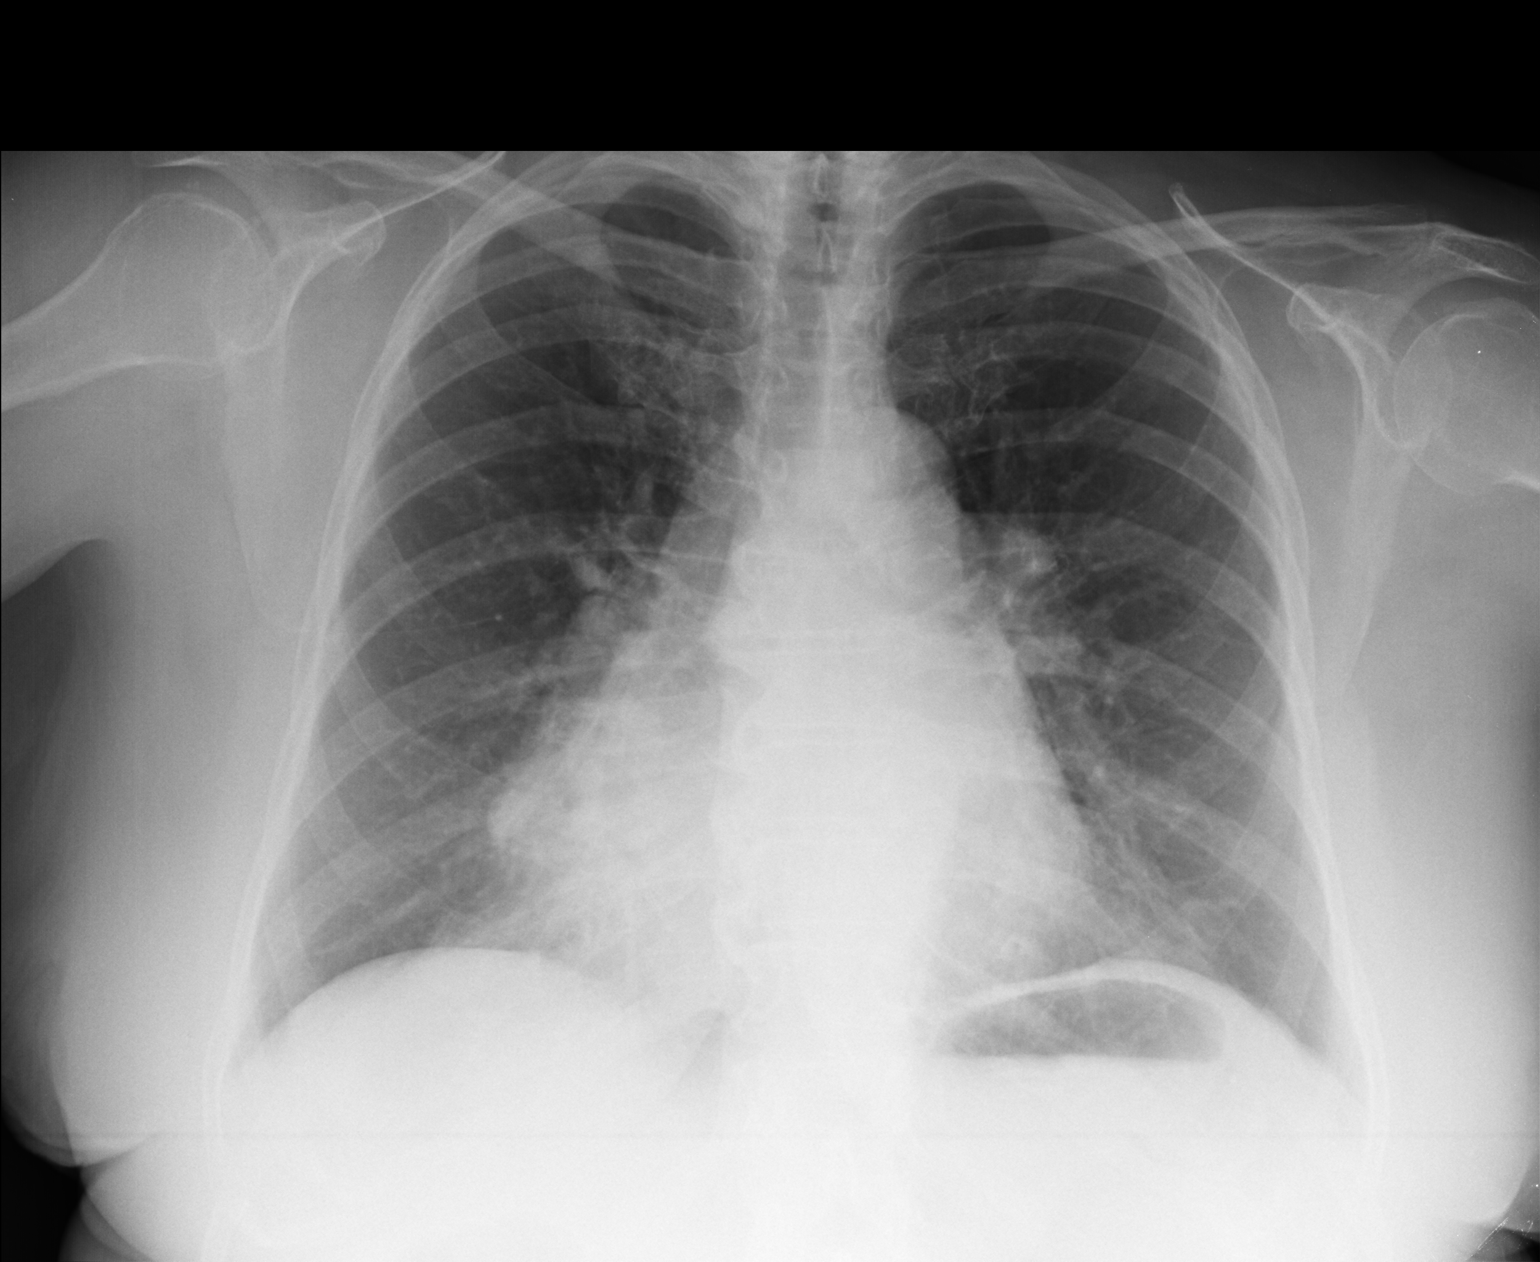

[lateral]
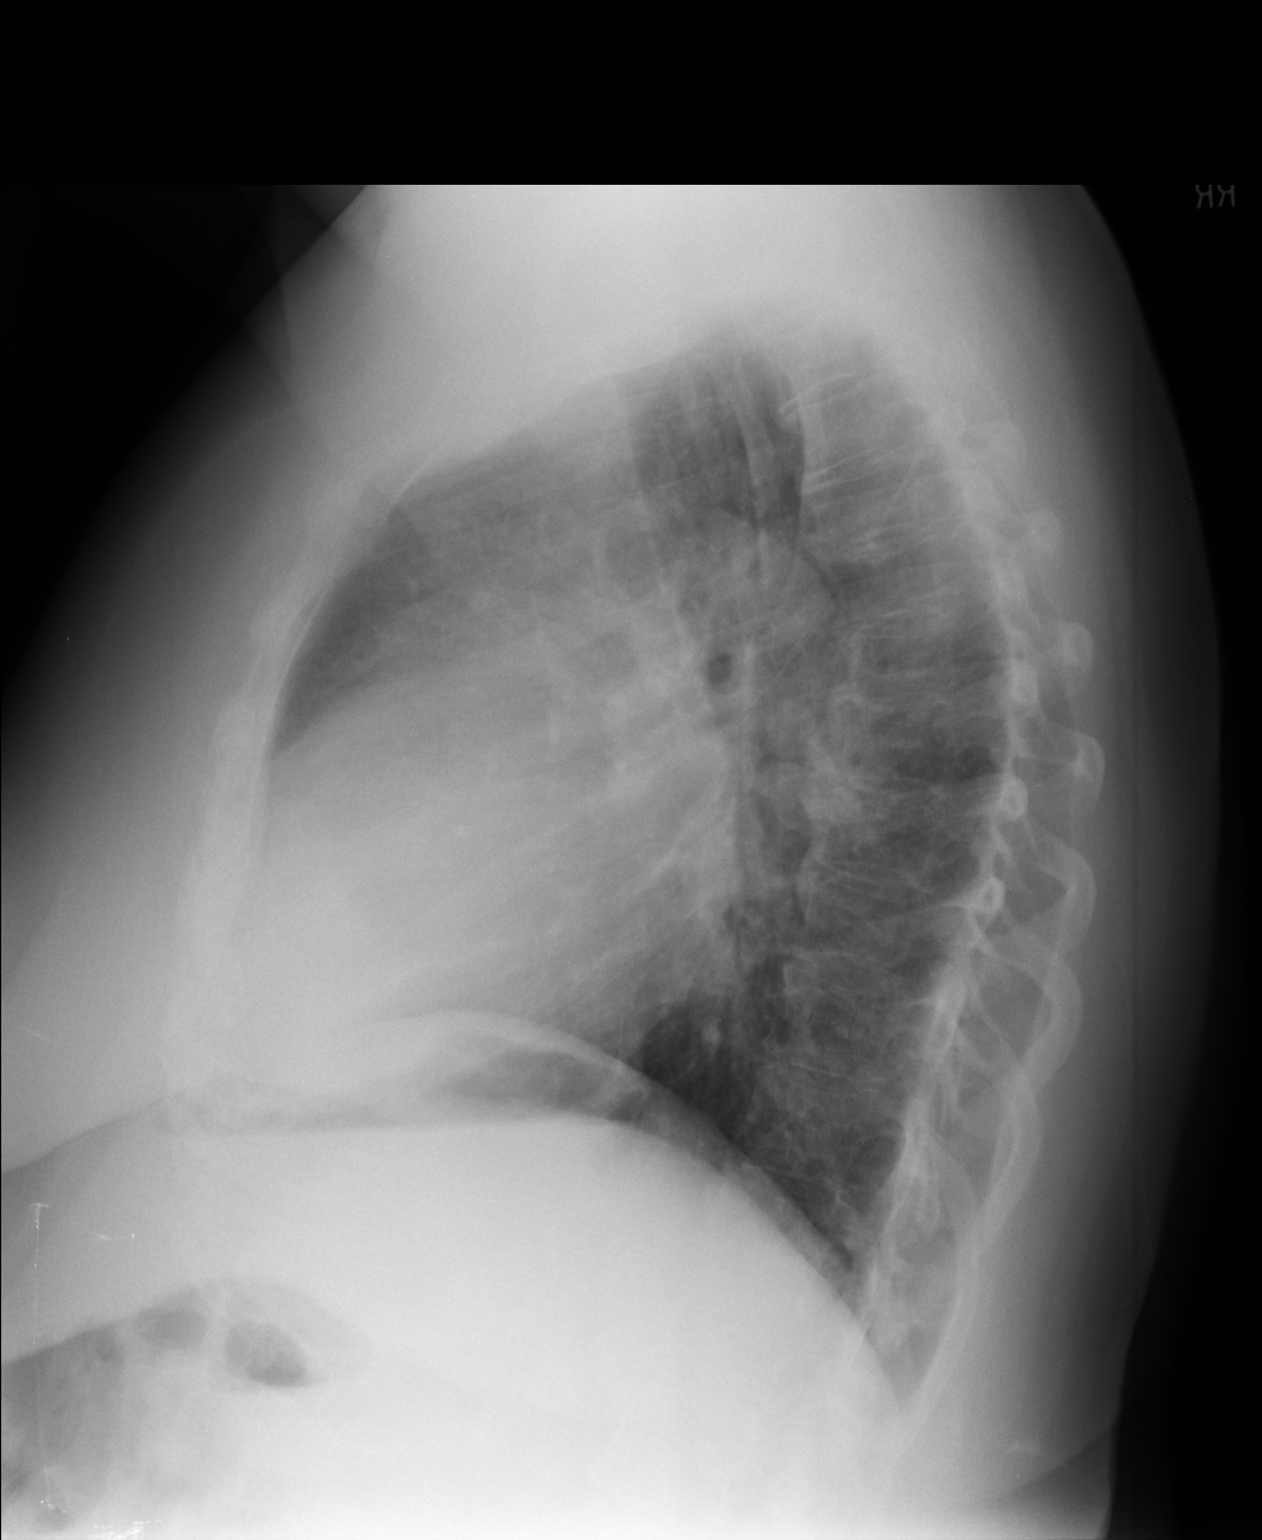

[2 of 2 positions shown; findings below may reference images not displayed]

FINDINGS: Suspected patchy opacity obscuring the right heart border, although
not well visualized on the lateral view. No pleural effusion or
pneumothorax.

The heart is top-normal in size.

Degenerative changes of the visualized thoracolumbar spine.
IMPRESSION: Patchy right middle lobe opacity, suspicious for pneumonia.

## 2017-04-12 IMAGING — CR DG CHEST 1V PORT
1 series · 1 of 1 positions shown · non-contrast
Comparison: 11/30/2015 at [DATE] p.m.

CLINICAL DATA: Adjustment of endotracheal tube

EXAM:
PORTABLE CHEST 1 VIEW

[AP]
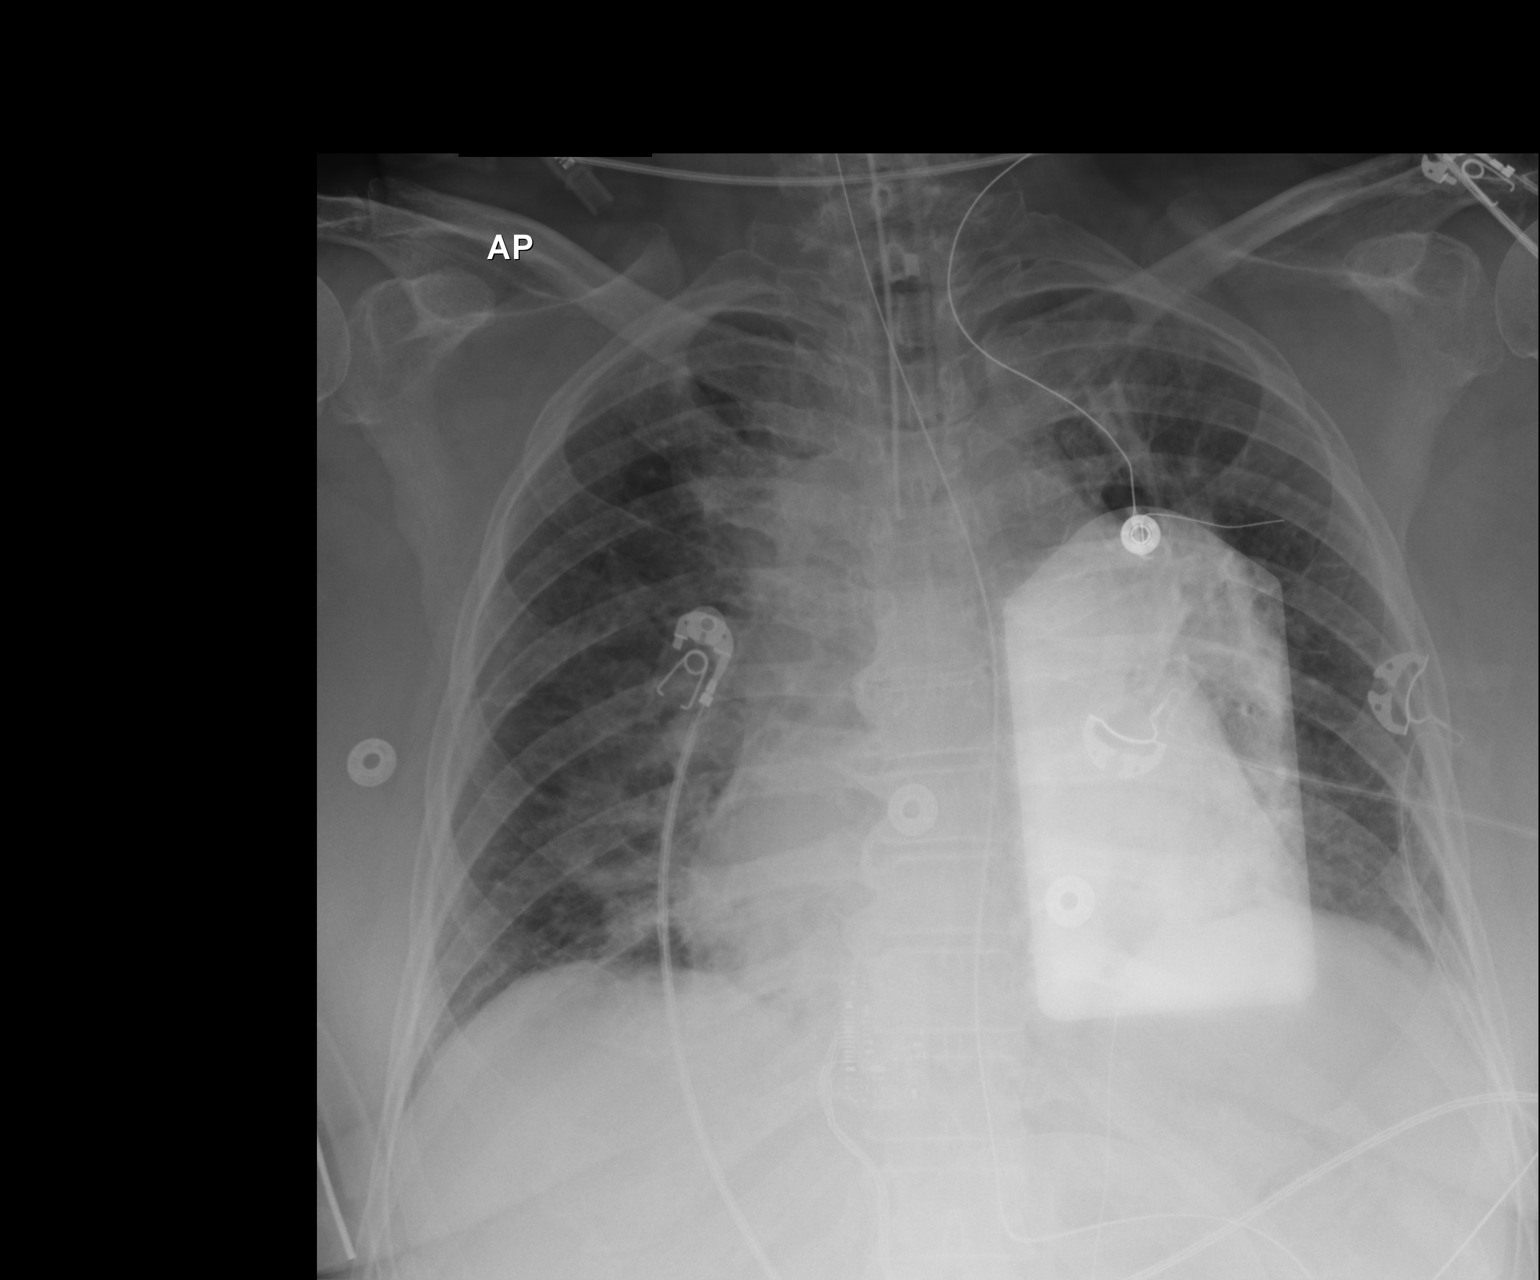

[1 of 1 positions shown; findings below may reference images not displayed]

FINDINGS: Endotracheal tube has been withdrawn. Tip now projects 1 cm above
the Carina.

Orogastric tube is stable and well positioned.

Central vascular congestion and central interstitial and hazy
airspace opacity in the lungs is unchanged from the earlier exam.
IMPRESSION: 1. Endotracheal tube tip now projects 1 cm above the Carina.
2. Lung findings with central interstitial airspace opacities,
likely due to pulmonary edema, are without change from the recent
prior exam.
